# Patient Record
Sex: Female | Born: 1940 | Race: Black or African American | Hispanic: No | Marital: Single | Smoking: Former smoker
Health system: Southern US, Community
[De-identification: ages and names within clinical notes are randomized; demographics above are authoritative.]

## PROBLEM LIST (undated history)

## (undated) DIAGNOSIS — E785 Hyperlipidemia, unspecified: Secondary | ICD-10-CM

## (undated) DIAGNOSIS — M1990 Unspecified osteoarthritis, unspecified site: Secondary | ICD-10-CM

## (undated) DIAGNOSIS — N819 Female genital prolapse, unspecified: Secondary | ICD-10-CM

## (undated) DIAGNOSIS — K900 Celiac disease: Secondary | ICD-10-CM

## (undated) DIAGNOSIS — J449 Chronic obstructive pulmonary disease, unspecified: Secondary | ICD-10-CM

## (undated) DIAGNOSIS — K754 Autoimmune hepatitis: Secondary | ICD-10-CM

## (undated) DIAGNOSIS — K146 Glossodynia: Secondary | ICD-10-CM

## (undated) DIAGNOSIS — F32A Depression, unspecified: Secondary | ICD-10-CM

## (undated) DIAGNOSIS — K8020 Calculus of gallbladder without cholecystitis without obstruction: Secondary | ICD-10-CM

## (undated) DIAGNOSIS — I2510 Atherosclerotic heart disease of native coronary artery without angina pectoris: Secondary | ICD-10-CM

## (undated) DIAGNOSIS — L409 Psoriasis, unspecified: Secondary | ICD-10-CM

## (undated) DIAGNOSIS — J309 Allergic rhinitis, unspecified: Secondary | ICD-10-CM

## (undated) DIAGNOSIS — I10 Essential (primary) hypertension: Secondary | ICD-10-CM

## (undated) DIAGNOSIS — F329 Major depressive disorder, single episode, unspecified: Secondary | ICD-10-CM

## (undated) DIAGNOSIS — M87051 Idiopathic aseptic necrosis of right femur: Secondary | ICD-10-CM

## (undated) DIAGNOSIS — M359 Systemic involvement of connective tissue, unspecified: Secondary | ICD-10-CM

## (undated) HISTORY — PX: AMPUTATION FINGER: SHX6594

## (undated) HISTORY — PX: MUSCLE BIOPSY: SHX716

## (undated) HISTORY — PX: LIVER BIOPSY: SHX301

## (undated) HISTORY — PX: TUBAL LIGATION: SHX77

## (undated) HISTORY — PX: CORONARY ANGIOPLASTY WITH STENT PLACEMENT: SHX49

## (undated) HISTORY — PX: EYE SURGERY: SHX253

---

## 2004-10-04 ENCOUNTER — Ambulatory Visit: Payer: Self-pay | Admitting: Unknown Physician Specialty

## 2005-01-31 ENCOUNTER — Ambulatory Visit: Payer: Self-pay | Admitting: Gastroenterology

## 2005-12-09 ENCOUNTER — Ambulatory Visit: Payer: Self-pay | Admitting: Unknown Physician Specialty

## 2007-02-04 ENCOUNTER — Ambulatory Visit: Payer: Self-pay | Admitting: Unknown Physician Specialty

## 2008-02-09 ENCOUNTER — Ambulatory Visit: Payer: Self-pay | Admitting: Unknown Physician Specialty

## 2008-09-18 ENCOUNTER — Emergency Department: Payer: Self-pay | Admitting: Emergency Medicine

## 2009-01-30 ENCOUNTER — Inpatient Hospital Stay: Payer: Self-pay | Admitting: Internal Medicine

## 2009-01-30 ENCOUNTER — Ambulatory Visit: Payer: Self-pay | Admitting: Gastroenterology

## 2009-02-10 ENCOUNTER — Ambulatory Visit: Payer: Self-pay | Admitting: Unknown Physician Specialty

## 2009-04-21 ENCOUNTER — Ambulatory Visit: Payer: Self-pay | Admitting: Neurology

## 2009-05-31 ENCOUNTER — Inpatient Hospital Stay: Payer: Self-pay | Admitting: Internal Medicine

## 2009-07-06 ENCOUNTER — Ambulatory Visit: Payer: Self-pay | Admitting: Rheumatology

## 2009-07-24 ENCOUNTER — Ambulatory Visit: Payer: Self-pay | Admitting: Unknown Physician Specialty

## 2010-02-27 ENCOUNTER — Ambulatory Visit: Payer: Self-pay | Admitting: Unknown Physician Specialty

## 2010-04-09 ENCOUNTER — Ambulatory Visit: Payer: Self-pay | Admitting: Gastroenterology

## 2010-07-27 ENCOUNTER — Ambulatory Visit: Payer: Self-pay | Admitting: Gastroenterology

## 2010-09-07 ENCOUNTER — Ambulatory Visit: Payer: Self-pay | Admitting: Gastroenterology

## 2010-09-12 LAB — PATHOLOGY REPORT

## 2010-11-13 ENCOUNTER — Ambulatory Visit: Payer: Self-pay | Admitting: Gastroenterology

## 2010-11-22 ENCOUNTER — Ambulatory Visit: Payer: Self-pay | Admitting: Gastroenterology

## 2011-01-10 ENCOUNTER — Ambulatory Visit: Payer: Self-pay | Admitting: Gastroenterology

## 2011-01-29 LAB — PATHOLOGY REPORT

## 2011-03-12 ENCOUNTER — Ambulatory Visit: Payer: Self-pay | Admitting: Rheumatology

## 2011-03-14 ENCOUNTER — Ambulatory Visit: Payer: Self-pay | Admitting: Unknown Physician Specialty

## 2011-03-26 ENCOUNTER — Ambulatory Visit: Payer: Self-pay | Admitting: Unknown Physician Specialty

## 2012-06-04 ENCOUNTER — Ambulatory Visit: Payer: Self-pay | Admitting: Internal Medicine

## 2012-07-30 ENCOUNTER — Emergency Department: Payer: Self-pay | Admitting: Emergency Medicine

## 2012-07-30 LAB — URINALYSIS, COMPLETE
Glucose,UR: NEGATIVE mg/dL (ref 0–75)
Ketone: NEGATIVE
Nitrite: NEGATIVE
Ph: 6 (ref 4.5–8.0)
Protein: NEGATIVE
RBC,UR: 37 /HPF (ref 0–5)
Specific Gravity: 1.013 (ref 1.003–1.030)
Squamous Epithelial: <1

## 2012-07-30 LAB — CBC
HGB: 15.9 g/dL (ref 12.0–16.0)
RBC: 4.68 10*6/uL (ref 3.80–5.20)
RDW: 15.2 % — ABNORMAL HIGH (ref 11.5–14.5)
WBC: 6.2 10*3/uL (ref 3.6–11.0)

## 2012-08-01 LAB — URINE CULTURE

## 2013-06-22 ENCOUNTER — Ambulatory Visit: Payer: Self-pay | Admitting: Internal Medicine

## 2014-10-04 ENCOUNTER — Ambulatory Visit: Payer: Self-pay | Admitting: Internal Medicine

## 2015-07-21 ENCOUNTER — Other Ambulatory Visit: Payer: Self-pay | Admitting: Internal Medicine

## 2015-07-21 DIAGNOSIS — Z1231 Encounter for screening mammogram for malignant neoplasm of breast: Secondary | ICD-10-CM

## 2015-10-06 ENCOUNTER — Ambulatory Visit
Admission: RE | Admit: 2015-10-06 | Discharge: 2015-10-06 | Disposition: A | Discharge status: Home / Self Care | Payer: Medicare Other | Source: Ambulatory Visit | Attending: Internal Medicine | Admitting: Internal Medicine

## 2015-10-06 ENCOUNTER — Other Ambulatory Visit: Payer: Self-pay | Admitting: Internal Medicine

## 2015-10-06 DIAGNOSIS — Z1231 Encounter for screening mammogram for malignant neoplasm of breast: Secondary | ICD-10-CM | POA: Insufficient documentation

## 2016-06-14 ENCOUNTER — Other Ambulatory Visit: Payer: Self-pay | Admitting: Rheumatology

## 2016-06-14 DIAGNOSIS — R1310 Dysphagia, unspecified: Secondary | ICD-10-CM

## 2016-06-14 DIAGNOSIS — K754 Autoimmune hepatitis: Secondary | ICD-10-CM

## 2016-06-20 ENCOUNTER — Ambulatory Visit
Admission: RE | Admit: 2016-06-20 | Discharge: 2016-06-20 | Disposition: A | Discharge status: Home / Self Care | Payer: Medicare Other | Source: Ambulatory Visit | Attending: Rheumatology | Admitting: Rheumatology

## 2016-06-20 DIAGNOSIS — K754 Autoimmune hepatitis: Secondary | ICD-10-CM | POA: Diagnosis not present

## 2016-06-20 DIAGNOSIS — K449 Diaphragmatic hernia without obstruction or gangrene: Secondary | ICD-10-CM | POA: Diagnosis not present

## 2016-06-20 DIAGNOSIS — M359 Systemic involvement of connective tissue, unspecified: Secondary | ICD-10-CM | POA: Diagnosis not present

## 2016-06-20 DIAGNOSIS — R05 Cough: Secondary | ICD-10-CM | POA: Insufficient documentation

## 2016-06-20 DIAGNOSIS — R1310 Dysphagia, unspecified: Secondary | ICD-10-CM

## 2016-06-20 DIAGNOSIS — R938 Abnormal findings on diagnostic imaging of other specified body structures: Secondary | ICD-10-CM | POA: Diagnosis not present

## 2016-06-20 DIAGNOSIS — R1319 Other dysphagia: Secondary | ICD-10-CM | POA: Diagnosis not present

## 2016-06-20 DIAGNOSIS — M3322 Polymyositis with myopathy: Secondary | ICD-10-CM | POA: Diagnosis not present

## 2016-06-20 DIAGNOSIS — K219 Gastro-esophageal reflux disease without esophagitis: Secondary | ICD-10-CM | POA: Diagnosis not present

## 2016-07-04 ENCOUNTER — Other Ambulatory Visit: Payer: Self-pay | Admitting: Specialist

## 2016-07-04 DIAGNOSIS — R0602 Shortness of breath: Secondary | ICD-10-CM

## 2016-07-26 ENCOUNTER — Other Ambulatory Visit: Payer: Self-pay | Admitting: Internal Medicine

## 2016-07-26 DIAGNOSIS — G3184 Mild cognitive impairment, so stated: Secondary | ICD-10-CM

## 2016-08-01 ENCOUNTER — Ambulatory Visit
Admission: RE | Admit: 2016-08-01 | Discharge: 2016-08-01 | Disposition: A | Discharge status: Home / Self Care | Payer: Medicare Other | Source: Ambulatory Visit | Attending: Specialist | Admitting: Specialist

## 2016-08-01 DIAGNOSIS — M359 Systemic involvement of connective tissue, unspecified: Secondary | ICD-10-CM | POA: Diagnosis not present

## 2016-08-01 DIAGNOSIS — I700 Atherosclerosis of aorta: Secondary | ICD-10-CM | POA: Insufficient documentation

## 2016-08-01 DIAGNOSIS — K449 Diaphragmatic hernia without obstruction or gangrene: Secondary | ICD-10-CM | POA: Insufficient documentation

## 2016-08-01 DIAGNOSIS — I2510 Atherosclerotic heart disease of native coronary artery without angina pectoris: Secondary | ICD-10-CM | POA: Diagnosis not present

## 2016-08-01 DIAGNOSIS — R0602 Shortness of breath: Secondary | ICD-10-CM | POA: Insufficient documentation

## 2016-08-01 DIAGNOSIS — J8410 Pulmonary fibrosis, unspecified: Secondary | ICD-10-CM | POA: Insufficient documentation

## 2016-08-01 DIAGNOSIS — R918 Other nonspecific abnormal finding of lung field: Secondary | ICD-10-CM | POA: Insufficient documentation

## 2016-08-01 DIAGNOSIS — J479 Bronchiectasis, uncomplicated: Secondary | ICD-10-CM | POA: Diagnosis not present

## 2016-08-15 ENCOUNTER — Ambulatory Visit
Admission: RE | Admit: 2016-08-15 | Discharge: 2016-08-15 | Disposition: A | Discharge status: Home / Self Care | Payer: Medicare Other | Source: Ambulatory Visit | Attending: Internal Medicine | Admitting: Internal Medicine

## 2016-08-15 DIAGNOSIS — I739 Peripheral vascular disease, unspecified: Secondary | ICD-10-CM | POA: Insufficient documentation

## 2016-08-15 DIAGNOSIS — G3184 Mild cognitive impairment, so stated: Secondary | ICD-10-CM | POA: Insufficient documentation

## 2016-08-15 MED ORDER — GADOBENATE DIMEGLUMINE 529 MG/ML IV SOLN
15.0000 mL | Freq: Once | INTRAVENOUS | Status: AC | PRN
  Administered 2016-08-15: 15 mL via INTRAVENOUS

## 2016-10-07 ENCOUNTER — Encounter: Payer: Self-pay | Admitting: *Deleted

## 2016-10-08 ENCOUNTER — Encounter
Admission: RE | Disposition: A | Discharge status: Home / Self Care | Payer: Self-pay | Source: Ambulatory Visit | Attending: Gastroenterology

## 2016-10-08 ENCOUNTER — Encounter: Payer: Self-pay | Admitting: Anesthesiology

## 2016-10-08 ENCOUNTER — Ambulatory Visit
Admission: RE | Admit: 2016-10-08 | Discharge: 2016-10-08 | Disposition: A | Discharge status: Home / Self Care | Payer: Medicare Other | Source: Ambulatory Visit | Attending: Gastroenterology | Admitting: Gastroenterology

## 2016-10-08 ENCOUNTER — Ambulatory Visit: Payer: Medicare Other | Admitting: Anesthesiology

## 2016-10-08 DIAGNOSIS — Z7951 Long term (current) use of inhaled steroids: Secondary | ICD-10-CM | POA: Insufficient documentation

## 2016-10-08 DIAGNOSIS — K449 Diaphragmatic hernia without obstruction or gangrene: Secondary | ICD-10-CM | POA: Diagnosis not present

## 2016-10-08 DIAGNOSIS — I2510 Atherosclerotic heart disease of native coronary artery without angina pectoris: Secondary | ICD-10-CM | POA: Diagnosis not present

## 2016-10-08 DIAGNOSIS — K224 Dyskinesia of esophagus: Secondary | ICD-10-CM | POA: Insufficient documentation

## 2016-10-08 DIAGNOSIS — M1990 Unspecified osteoarthritis, unspecified site: Secondary | ICD-10-CM | POA: Diagnosis not present

## 2016-10-08 DIAGNOSIS — J849 Interstitial pulmonary disease, unspecified: Secondary | ICD-10-CM | POA: Diagnosis not present

## 2016-10-08 DIAGNOSIS — Z7952 Long term (current) use of systemic steroids: Secondary | ICD-10-CM | POA: Insufficient documentation

## 2016-10-08 DIAGNOSIS — K219 Gastro-esophageal reflux disease without esophagitis: Secondary | ICD-10-CM | POA: Diagnosis not present

## 2016-10-08 DIAGNOSIS — K2950 Unspecified chronic gastritis without bleeding: Secondary | ICD-10-CM | POA: Diagnosis not present

## 2016-10-08 DIAGNOSIS — I10 Essential (primary) hypertension: Secondary | ICD-10-CM | POA: Diagnosis not present

## 2016-10-08 DIAGNOSIS — J449 Chronic obstructive pulmonary disease, unspecified: Secondary | ICD-10-CM | POA: Insufficient documentation

## 2016-10-08 DIAGNOSIS — R933 Abnormal findings on diagnostic imaging of other parts of digestive tract: Secondary | ICD-10-CM | POA: Diagnosis present

## 2016-10-08 DIAGNOSIS — Z79899 Other long term (current) drug therapy: Secondary | ICD-10-CM | POA: Diagnosis not present

## 2016-10-08 DIAGNOSIS — I7300 Raynaud's syndrome without gangrene: Secondary | ICD-10-CM | POA: Insufficient documentation

## 2016-10-08 DIAGNOSIS — E785 Hyperlipidemia, unspecified: Secondary | ICD-10-CM | POA: Insufficient documentation

## 2016-10-08 DIAGNOSIS — F329 Major depressive disorder, single episode, unspecified: Secondary | ICD-10-CM | POA: Diagnosis not present

## 2016-10-08 DIAGNOSIS — Z87891 Personal history of nicotine dependence: Secondary | ICD-10-CM | POA: Insufficient documentation

## 2016-10-08 DIAGNOSIS — Z7983 Long term (current) use of bisphosphonates: Secondary | ICD-10-CM | POA: Insufficient documentation

## 2016-10-08 HISTORY — DX: Autoimmune hepatitis: K75.4

## 2016-10-08 HISTORY — DX: Glossodynia: K14.6

## 2016-10-08 HISTORY — DX: Atherosclerotic heart disease of native coronary artery without angina pectoris: I25.10

## 2016-10-08 HISTORY — DX: Calculus of gallbladder without cholecystitis without obstruction: K80.20

## 2016-10-08 HISTORY — DX: Idiopathic aseptic necrosis of right femur: M87.051

## 2016-10-08 HISTORY — DX: Essential (primary) hypertension: I10

## 2016-10-08 HISTORY — DX: Depression, unspecified: F32.A

## 2016-10-08 HISTORY — PX: ESOPHAGOGASTRODUODENOSCOPY (EGD) WITH PROPOFOL: SHX5813

## 2016-10-08 HISTORY — DX: Hyperlipidemia, unspecified: E78.5

## 2016-10-08 HISTORY — DX: Female genital prolapse, unspecified: N81.9

## 2016-10-08 HISTORY — DX: Celiac disease: K90.0

## 2016-10-08 HISTORY — DX: Systemic involvement of connective tissue, unspecified: M35.9

## 2016-10-08 HISTORY — DX: Psoriasis, unspecified: L40.9

## 2016-10-08 HISTORY — DX: Chronic obstructive pulmonary disease, unspecified: J44.9

## 2016-10-08 HISTORY — DX: Major depressive disorder, single episode, unspecified: F32.9

## 2016-10-08 HISTORY — DX: Allergic rhinitis, unspecified: J30.9

## 2016-10-08 HISTORY — DX: Unspecified osteoarthritis, unspecified site: M19.90

## 2016-10-08 SURGERY — ESOPHAGOGASTRODUODENOSCOPY (EGD) WITH PROPOFOL
Anesthesia: General

## 2016-10-08 MED ORDER — MIDAZOLAM HCL 2 MG/2ML IJ SOLN
INTRAMUSCULAR | Status: DC | PRN
  Administered 2016-10-08: 1 mg via INTRAVENOUS

## 2016-10-08 MED ORDER — PROPOFOL 500 MG/50ML IV EMUL
INTRAVENOUS | Status: DC | PRN
  Administered 2016-10-08: 120 ug/kg/min via INTRAVENOUS

## 2016-10-08 MED ORDER — FENTANYL CITRATE (PF) 100 MCG/2ML IJ SOLN
INTRAMUSCULAR | Status: DC | PRN
  Administered 2016-10-08: 50 ug via INTRAVENOUS

## 2016-10-08 MED ORDER — LIDOCAINE HCL (CARDIAC) 20 MG/ML IV SOLN
INTRAVENOUS | Status: DC | PRN
  Administered 2016-10-08: 30 mg via INTRAVENOUS

## 2016-10-08 MED ORDER — SODIUM CHLORIDE 0.9 % IV SOLN: INTRAVENOUS | Status: DC

## 2016-10-08 MED ORDER — SODIUM CHLORIDE 0.9 % IV SOLN
INTRAVENOUS | Status: DC
  Administered 2016-10-08: 07:00:00 via INTRAVENOUS

## 2016-10-08 MED ORDER — EPHEDRINE SULFATE 50 MG/ML IJ SOLN
INTRAMUSCULAR | Status: DC | PRN
  Administered 2016-10-08 (×2): 10 mg via INTRAVENOUS
  Administered 2016-10-08: 5 mg via INTRAVENOUS

## 2016-10-08 NOTE — H&P (Signed)
Outpatient short stay form Pre-procedure 10/08/2016 7:47 AM Lollie Sails MD  Primary Physician: Dr Tracie Harrier  Reason for visit:  EGD  History of present illness:  Patient is a 75 year old female presenting today for further evaluation of abnormal GI x-ray. She had a upper GI series in relation to atypical dysphagia that showed an irregularity at the bottom of the esophagus/GE junction region. There was no actual stenosis and indeed a broadening of the lumen in that area with a circular structure that did not look like a ring and did not seem to decrease the size of the lumen. She does have a history of mixed connective tissue disease with myopathy, positive F ANA, positive SCL 70 antibodies, positive SSA antibodies, Raynaud's syndrome, interstitial lung disease. She currently does take azathioprine as well as prednisone on a regular basis. She denies use of any aspirin products or blood thinning agents  She does have a history of previous diagnosis with celiac sprue. She is not on a strict gluten diet.  She is presenting today for further evaluation.    Current Facility-Administered Medications:  .  0.9 %  sodium chloride infusion, , Intravenous, Continuous, Lollie Sails, MD, Last Rate: 20 mL/hr at 10/08/16 0719 .  0.9 %  sodium chloride infusion, , Intravenous, Continuous, Lollie Sails, MD  Prescriptions Prior to Admission  Medication Sig Dispense Refill Last Dose  . albuterol (PROVENTIL HFA;VENTOLIN HFA) 108 (90 Base) MCG/ACT inhaler Inhale into the lungs every 6 (six) hours as needed for wheezing or shortness of breath.   10/07/2016 at Unknown time  . alendronate (FOSAMAX) 70 MG tablet Take 70 mg by mouth once a week. Take with a full glass of water on an empty stomach.   10/07/2016 at Unknown time  . azaTHIOprine (IMURAN) 50 MG tablet Take 50 mg by mouth daily.   10/07/2016 at Unknown time  . buPROPion (WELLBUTRIN SR) 150 MG 12 hr tablet Take 150 mg by mouth 2 (two)  times daily.   10/07/2016 at Unknown time  . Calcium Carb-Cholecalciferol (CALCIUM 500+D) 500-400 MG-UNIT TABS Take 1 tablet by mouth daily.   Past Week at Unknown time  . fluticasone (FLOVENT HFA) 110 MCG/ACT inhaler Inhale 1 puff into the lungs 2 (two) times daily.   10/07/2016 at Unknown time  . metoprolol succinate (TOPROL-XL) 100 MG 24 hr tablet Take 100 mg by mouth daily. Take with or immediately following a meal.   10/07/2016 at Unknown time  . multivitamin-iron-minerals-folic acid (CENTRUM) chewable tablet Chew 1 tablet by mouth daily.   Past Week at Unknown time  . olmesartan (BENICAR) 40 MG tablet Take 40 mg by mouth daily.   10/08/2016 at 0530  . omeprazole (PRILOSEC) 20 MG capsule Take 20 mg by mouth 2 (two) times daily before a meal.   10/07/2016 at Unknown time  . predniSONE (DELTASONE) 10 MG tablet Take 5 mg by mouth daily with breakfast.   10/07/2016 at Unknown time  . simvastatin (ZOCOR) 20 MG tablet Take 20 mg by mouth daily.   10/07/2016 at Unknown time  . traZODone (DESYREL) 50 MG tablet Take 50 mg by mouth at bedtime.   10/07/2016 at Unknown time  . triamterene-hydrochlorothiazide (MAXZIDE-25) 37.5-25 MG tablet Take 1 tablet by mouth daily.   10/07/2016 at Unknown time     Allergies  Allergen Reactions  . Fluorescein Other (See Comments)    intolerance  . Neo-Synephrine [Phenylephrine Hcl]   . Phenylephrine      Past Medical  History:  Diagnosis Date  . Allergic rhinitis   . Arthritis   . Autoimmune hepatitis ([place])   . Avascular necrosis of bone of hip, right ([place])   . Burning mouth syndrome   . Celiac sprue   . Connective tissue disease ([place])   . COPD (chronic obstructive pulmonary disease) ([place])   . Coronary artery disease   . Depression   . Gall stones   . Hyperlipidemia   . Hypertension   . Pelvic prolapse   . Psoriasis     Review of systems:      Physical Exam    Heart and lungs: Regular rate and rhythm without rub or gallop, lungs are  bilaterally clear    HEENT: Normocephalic atraumatic eyes are anicteric    Other:     Pertinant exam for procedure: Soft nontender nondistended bowel sounds positive normoactive.    Planned proceedures: EGD and indicated procedures. I have discussed the risks benefits and complications of procedures to include not limited to bleeding, infection, perforation and the risk of sedation and the patient wishes to proceed.    Lollie Sails, MD Gastroenterology 10/08/2016  7:47 AM

## 2016-10-08 NOTE — Anesthesia Procedure Notes (Signed)
Performed by: COOK-MARTIN, Marquette Blodgett Pre-anesthesia Checklist: Patient identified, Emergency Drugs available, Suction available, Patient being monitored and Timeout performed Patient Re-evaluated:Patient Re-evaluated prior to inductionOxygen Delivery Method: Nasal cannula Preoxygenation: Pre-oxygenation with 100% oxygen Intubation Type: IV induction Airway Equipment and Method: Bite block Placement Confirmation: CO2 detector and positive ETCO2     

## 2016-10-08 NOTE — Anesthesia Postprocedure Evaluation (Signed)
Anesthesia Post Note  Patient: Megan Baker  Procedure(s) Performed: Procedure(s) (LRB): ESOPHAGOGASTRODUODENOSCOPY (EGD) WITH PROPOFOL (N/A)  Patient location during evaluation: PACU Anesthesia Type: General Level of consciousness: awake Pain management: satisfactory to patient Vital Signs Assessment: post-procedure vital signs reviewed and stable Respiratory status: spontaneous breathing Cardiovascular status: stable Anesthetic complications: no    Last Vitals:  Vitals:   10/08/16 0840 10/08/16 0850  BP: (!) 114/51 (!) 115/51  Pulse: 86 95  Resp: 18 14  Temp:      Last Pain:  Vitals:   10/08/16 0820  TempSrc: Tympanic                 VAN Baker,Megan Jolliff

## 2016-10-08 NOTE — Anesthesia Preprocedure Evaluation (Addendum)
Anesthesia Evaluation  Patient identified by MRN, date of birth, ID band Patient awake    Reviewed: Allergy & Precautions, NPO status , Patient's Chart, lab work & pertinent test results  Airway Mallampati: III       Dental  (+) Upper Dentures, Lower Dentures   Pulmonary COPD, former smoker,    breath sounds clear to auscultation       Cardiovascular hypertension, Pt. on medications and Pt. on home beta blockers + CAD   Rhythm:Regular     Neuro/Psych Depression    GI/Hepatic negative GI ROS, (+) Hepatitis -, Autoimmune  Endo/Other  negative endocrine ROS  Renal/GU negative Renal ROS     Musculoskeletal   Abdominal   Peds  Hematology   Anesthesia Other Findings   Reproductive/Obstetrics                            Anesthesia Physical Anesthesia Plan  ASA: III  Anesthesia Plan: General   Post-op Pain Management:    Induction: Intravenous  Airway Management Planned: Natural Airway and Nasal Cannula  Additional Equipment:   Intra-op Plan:   Post-operative Plan:   Informed Consent: I have reviewed the patients History and Physical, chart, labs and discussed the procedure including the risks, benefits and alternatives for the proposed anesthesia with the patient or authorized representative who has indicated his/her understanding and acceptance.     Plan Discussed with: CRNA  Anesthesia Plan Comments:         Anesthesia Quick Evaluation

## 2016-10-08 NOTE — Transfer of Care (Signed)
Immediate Anesthesia Transfer of Care Note  Patient: Megan Baker  Procedure(s) Performed: Procedure(s): ESOPHAGOGASTRODUODENOSCOPY (EGD) WITH PROPOFOL (N/A)  Patient Location: PACU  Anesthesia Type:General  Level of Consciousness: awake and sedated  Airway & Oxygen Therapy: Patient Spontanous Breathing and Patient connected to nasal cannula oxygen  Post-op Assessment: Report given to RN and Post -op Vital signs reviewed and stable  Post vital signs: Reviewed and stable  Last Vitals:  Vitals:   10/08/16 0708  BP: (!) 145/69  Pulse: 87  Resp: 16  Temp: (!) 35.9 C    Last Pain:  Vitals:   10/08/16 0708  TempSrc: Tympanic         Complications: No apparent anesthesia complications

## 2016-10-08 NOTE — Op Note (Addendum)
[facility] Patient Name: Megan Baker Procedure Date: 10/08/2016 7:51 AM MRN: [id number] Account #: [id number] Date of Birth: Jul 08, 1941 Admit Type: Inpatient Age: 75 Room: [facility] ENDO ROOM 3 Gender: Female Note Status: Finalized Procedure:            Upper GI endoscopy Indications:          Gastro-esophageal reflux disease Providers:            Lollie Sails, MD Referring MD:         Tracie Harrier, MD (Referring MD) Medicines:            Monitored Anesthesia Care Complications:        No immediate complications. Procedure:            Pre-Anesthesia Assessment:                       - ASA Grade Assessment: III - A patient with severe                        systemic disease.                       After obtaining informed consent, the endoscope was                        passed under direct vision. Throughout the procedure,                        the patient's blood pressure, pulse, and oxygen                        saturations were monitored continuously. The Endoscope                        was introduced through the mouth, and advanced to the                        fourth part of duodenum. The upper GI endoscopy was                        accomplished without difficulty. The patient tolerated                        the procedure well. Findings:      Abnormal motility was noted in the upper third of the esophagus, in the       middle third of the esophagus and in the lower third of the esophagus.       The cricopharyngeus was normal. There is a decrease in motility of the       esophageal body. The distal esophagus/lower esophageal sphincter is       spastic, but gives up passage to the endoscope. There is a pouch-like       dilation of the esophagus for about 4 cm proximal to the GE junction.      Abnormal motility was noted at the gastroesophageal junction. The       cricopharyngeus was normal. The distal esophagus/lower esophageal      sphincter is spastic, but gives up passage to the endoscope.      Diffuse mild mucosal variance characterized by altered texture was found  in the entire examined stomach. Biopsies were taken with a cold forceps       for Helicobacter pylori testing. Biopsies were taken with a cold forceps       for histology.      A small hiatal hernia was present.      A TTS dilator was passed through the scope. Dilation with a 12-13.5-15       mm balloon dilator was performed to 15 mm at the gastroesophageal       junction.      Diffuse mucosal flattening was found in the entire examined duodenum.       Biopsies for histology were taken with a cold forceps for evaluation of       celiac disease.      The cardia and gastric fundus were normal on retroflexion otherwise. Impression:           - Abnormal esophageal motility, suspicious for                        aperistalsis.                       - Abnormal esophageal motility, suspicious for                        scleroderma.                       - Gastric mucosal variant. Biopsied.                       - Small hiatal hernia.                       - Flattened mucosa was found in the duodenum,                        consistent with celiac disease. Biopsied.                       - Dilation performed at the gastroesophageal junction.                        Of note the GE junction appears to have a more tonic                        closure, but there is no evidence of mass or                        inflammation. The entirre esophagus more proximal than                        the GE junction shows atonia, concern for possible                        achalasia versus changes of scleroderma. Recommendation:       - Continue present medications.                       - Await pathology results.                       - Return to GI clinic in 3 weeks.                       -  Gluten free diet. Procedure Code(s):    --- Professional ---                        [phone], Esophagogastroduodenoscopy, flexible, transoral;                        with transendoscopic balloon dilation of esophagus                        (less than 30 mm diameter)                       [id number], Esophagogastroduodenoscopy, flexible, transoral;                        with biopsy, single or multiple Diagnosis Code(s):    --- Professional ---                       K22.4, Dyskinesia of esophagus                       K31.89, Other diseases of stomach and duodenum                       K44.9, Diaphragmatic hernia without obstruction or                        gangrene                       K21.9, Gastro-esophageal reflux disease without                        esophagitis CPT copyright 2016 American Medical Association. All rights reserved. The codes documented in this report are preliminary and upon coder review may  be revised to meet current compliance requirements. Lollie Sails, MD 10/08/2016 8:29:16 AM This report has been signed electronically. Number of Addenda: 0 Note Initiated On: 10/08/2016 7:51 AM      [facility]

## 2016-10-10 LAB — SURGICAL PATHOLOGY

## 2016-10-12 ENCOUNTER — Encounter: Payer: Self-pay | Admitting: Gastroenterology

## 2017-04-24 ENCOUNTER — Other Ambulatory Visit: Payer: Self-pay | Admitting: Specialist

## 2017-04-24 DIAGNOSIS — R911 Solitary pulmonary nodule: Secondary | ICD-10-CM

## 2017-05-19 ENCOUNTER — Encounter: Payer: Self-pay | Admitting: Emergency Medicine

## 2017-05-19 ENCOUNTER — Emergency Department: Payer: Medicare Other

## 2017-05-19 ENCOUNTER — Emergency Department
Admission: EM | Admit: 2017-05-19 | Discharge: 2017-05-19 | Disposition: A | Discharge status: Home / Self Care | Payer: Medicare Other | Attending: Emergency Medicine | Admitting: Emergency Medicine

## 2017-05-19 DIAGNOSIS — S60221A Contusion of right hand, initial encounter: Secondary | ICD-10-CM | POA: Diagnosis not present

## 2017-05-19 DIAGNOSIS — I2510 Atherosclerotic heart disease of native coronary artery without angina pectoris: Secondary | ICD-10-CM | POA: Diagnosis not present

## 2017-05-19 DIAGNOSIS — W19XXXA Unspecified fall, initial encounter: Secondary | ICD-10-CM | POA: Insufficient documentation

## 2017-05-19 DIAGNOSIS — I10 Essential (primary) hypertension: Secondary | ICD-10-CM | POA: Insufficient documentation

## 2017-05-19 DIAGNOSIS — Z87891 Personal history of nicotine dependence: Secondary | ICD-10-CM | POA: Insufficient documentation

## 2017-05-19 DIAGNOSIS — J449 Chronic obstructive pulmonary disease, unspecified: Secondary | ICD-10-CM | POA: Insufficient documentation

## 2017-05-19 DIAGNOSIS — Y939 Activity, unspecified: Secondary | ICD-10-CM | POA: Diagnosis not present

## 2017-05-19 DIAGNOSIS — Z79899 Other long term (current) drug therapy: Secondary | ICD-10-CM | POA: Insufficient documentation

## 2017-05-19 DIAGNOSIS — S6991XA Unspecified injury of right wrist, hand and finger(s), initial encounter: Secondary | ICD-10-CM | POA: Diagnosis present

## 2017-05-19 DIAGNOSIS — Y929 Unspecified place or not applicable: Secondary | ICD-10-CM | POA: Insufficient documentation

## 2017-05-19 DIAGNOSIS — Y999 Unspecified external cause status: Secondary | ICD-10-CM | POA: Insufficient documentation

## 2017-05-19 NOTE — ED Notes (Signed)
See triage note  States she fell last pm  Right hand swollen and bruised  Increased pain with movement

## 2017-05-19 NOTE — ED Notes (Signed)
Pt alert and oriented X4, active, cooperative, pt in NAD. RR even and unlabored, color WNL.  Pt informed to return if any life threatening symptoms occur.   

## 2017-05-19 NOTE — ED Provider Notes (Signed)
[facility] Emergency Department Provider Note   ____________________________________________   I have reviewed the triage vital signs and the nursing notes.   HISTORY  Chief Complaint Hand Pain    HPI Megan Baker is a 76 y.o. female presents with right hand pain, swelling, and bruising after falling yesterday. She describes no sensation changes or loss of function of the right hand after the fall Patient is unable to fully describe the fall but she fell she landed on the back and the knuckles of her hand. She denies any other injuries or loss of consciousness from the fall. Patient denies any past injuries to the right hand or upper extremity.   Past Medical History:  Diagnosis Date  . Allergic rhinitis   . Arthritis   . Autoimmune hepatitis ([place])   . Avascular necrosis of bone of hip, right ([place])   . Burning mouth syndrome   . Celiac sprue   . Connective tissue disease ([place])   . COPD (chronic obstructive pulmonary disease) ([place])   . Coronary artery disease   . Depression   . Gall stones   . Hyperlipidemia   . Hypertension   . Pelvic prolapse   . Psoriasis     There are no active problems to display for this patient.   Past Surgical History:  Procedure Laterality Date  . AMPUTATION FINGER    . CORONARY ANGIOPLASTY WITH STENT PLACEMENT    . ESOPHAGOGASTRODUODENOSCOPY (EGD) WITH PROPOFOL N/A 10/08/2016   Procedure: ESOPHAGOGASTRODUODENOSCOPY (EGD) WITH PROPOFOL;  Surgeon: Lollie Sails, MD;  Location: [facility] ENDOSCOPY;  Service: Endoscopy;  Laterality: N/A;  . EYE SURGERY    . LIVER BIOPSY    . MUSCLE BIOPSY    . TUBAL LIGATION      Prior to Admission medications   Medication Sig Start Date End Date Taking? Authorizing Provider  albuterol (PROVENTIL HFA;VENTOLIN HFA) 108 (90 Base) MCG/ACT inhaler Inhale into the lungs every 6 (six) hours as needed for wheezing or shortness of breath.    [provider]  alendronate  (FOSAMAX) 70 MG tablet Take 70 mg by mouth once a week. Take with a full glass of water on an empty stomach.    [provider]  azaTHIOprine (IMURAN) 50 MG tablet Take 50 mg by mouth daily.    [provider]  buPROPion (WELLBUTRIN SR) 150 MG 12 hr tablet Take 150 mg by mouth 2 (two) times daily.    [provider]  Calcium Carb-Cholecalciferol (CALCIUM 500+D) 500-400 MG-UNIT TABS Take 1 tablet by mouth daily.    [provider]  fluticasone (FLOVENT HFA) 110 MCG/ACT inhaler Inhale 1 puff into the lungs 2 (two) times daily.    [provider]  metoprolol succinate (TOPROL-XL) 100 MG 24 hr tablet Take 100 mg by mouth daily. Take with or immediately following a meal.    [provider]  multivitamin-iron-minerals-folic acid (CENTRUM) chewable tablet Chew 1 tablet by mouth daily.    [provider]  olmesartan (BENICAR) 40 MG tablet Take 40 mg by mouth daily.    [provider]  omeprazole (PRILOSEC) 20 MG capsule Take 20 mg by mouth 2 (two) times daily before a meal.    [provider]  predniSONE (DELTASONE) 10 MG tablet Take 5 mg by mouth daily with breakfast.    [provider]  simvastatin (ZOCOR) 20 MG tablet Take 20 mg by mouth daily.    [provider]  traZODone (DESYREL) 50  MG tablet Take 50 mg by mouth at bedtime.    [provider]  triamterene-hydrochlorothiazide (MAXZIDE-25) 37.5-25 MG tablet Take 1 tablet by mouth daily.    [provider]    Allergies Fluorescein; Neo-synephrine [phenylephrine hcl]; and Phenylephrine  Family History  Problem Relation Age of Onset  . Breast cancer Neg Hx     Social History Social History  Substance Use Topics  . Smoking status: Former Research scientist (life sciences)  . Smokeless tobacco: Never Used  . Alcohol use No    Review of Systems Constitutional: Negative for fever/chills Eyes: No visual changes. Cardiovascular: Denies chest  pain. Respiratory: Denies shortness of breath. Musculoskeletal: Right hand pain, swelling and bruising. Skin: Negative for rash. Neurological: Negative focal weakness or numbness. Negative for loss of consciousness. ____________________________________________   PHYSICAL EXAM:  VITAL SIGNS: ED Triage Vitals [05/19/17 1111]  Enc Vitals Group     BP (!) 144/63     Pulse Rate 85     Resp 16     Temp 98.6 F (37 C)     Temp Source Oral     SpO2 98 %     Weight 160 lb (72.6 kg)     Height 5' 2"  (1.575 m)     Head Circumference      Peak Flow      Pain Score 8     Pain Loc      Pain Edu?      Excl. in [place]?     Constitutional: Alert and oriented. Well appearing and in no acute distress.  Head: Normocephalic and atraumatic. Eyes: Conjunctivae are normal.  Cardiovascular: Normal rate, regular rhythm. Normal distal pulses. Respiratory: Normal respiratory effort.  Musculoskeletal: Right hand contusion with ecchymosis along the dorsal aspect secondary to fall. Sensation and range of motion intact. Neurologic: Normal speech and language. No gross focal neurologic deficits are appreciated.  Skin:  Skin is warm, dry and intact. No rash noted. Psychiatric: Mood and affect are normal.  ____________________________________________   LABS (all labs ordered are listed, but only abnormal results are displayed)  Labs Reviewed - No data to display ____________________________________________  EKG None ____________________________________________  RADIOLOGY DG right hand  FINDINGS: Osseous demineralization. Minimal degenerative changes at first [facility] joint. No acute fracture, dislocation, or bone destruction. Soft tissue swelling at dorsum of hand overlying the MCP joints.  IMPRESSION: No acute osseous abnormalities. ____________________________________________   PROCEDURES  Procedure(s) performed: no    Critical Care performed:  no ____________________________________________   INITIAL IMPRESSION / ASSESSMENT AND PLAN / ED COURSE  Pertinent labs & imaging results that were available during my care of the patient were reviewed by me and considered in my medical decision making (see chart for details).  Patient presents with right hand pain, contusion with ecchymosis secondary to fall yesterday. Physical exam findings and imaging are reassuring for no acute fracture or neurovascular deficits to the right hand. Recommended patient modify daily activities, use the RICE method for symptom management in addition to NSAIDs as needed. Patient / Family informed of clinical course, understand medical decision-making process, and agree with plan.  Patient was advised to follow up with PCP and was also advised to return to the emergency department for symptoms that change or worsen.      ____________________________________________   FINAL CLINICAL IMPRESSION(S) / ED DIAGNOSES  Final diagnoses:  None       NEW MEDICATIONS STARTED DURING THIS VISIT:  New Prescriptions   No medications on file  Note:  This document was prepared using Dragon voice recognition software and may include unintentional dictation errors.    Jerolyn Shin, PA-C 05/19/17 1325    Harvest Dark, MD 05/19/17 1402

## 2017-05-19 NOTE — ED Triage Notes (Signed)
Pt reports falling onto right hand last night, bruising and swelling noted to knuckles.

## 2017-08-19 ENCOUNTER — Ambulatory Visit: Payer: Medicare Other

## 2017-08-28 ENCOUNTER — Ambulatory Visit
Admission: RE | Admit: 2017-08-28 | Discharge: 2017-08-28 | Disposition: A | Discharge status: Home / Self Care | Payer: Medicare Other | Source: Ambulatory Visit | Attending: Specialist | Admitting: Specialist

## 2017-08-28 DIAGNOSIS — J8410 Pulmonary fibrosis, unspecified: Secondary | ICD-10-CM | POA: Diagnosis present

## 2017-08-28 DIAGNOSIS — J479 Bronchiectasis, uncomplicated: Secondary | ICD-10-CM | POA: Diagnosis not present

## 2017-08-28 DIAGNOSIS — R911 Solitary pulmonary nodule: Secondary | ICD-10-CM

## 2017-08-28 DIAGNOSIS — K449 Diaphragmatic hernia without obstruction or gangrene: Secondary | ICD-10-CM | POA: Insufficient documentation

## 2017-08-28 DIAGNOSIS — I700 Atherosclerosis of aorta: Secondary | ICD-10-CM | POA: Diagnosis not present

## 2017-08-28 DIAGNOSIS — I2510 Atherosclerotic heart disease of native coronary artery without angina pectoris: Secondary | ICD-10-CM | POA: Insufficient documentation

## 2017-09-18 ENCOUNTER — Other Ambulatory Visit: Payer: Self-pay | Admitting: Specialist

## 2017-09-18 DIAGNOSIS — R911 Solitary pulmonary nodule: Secondary | ICD-10-CM

## 2018-01-22 ENCOUNTER — Other Ambulatory Visit: Payer: Self-pay | Admitting: Internal Medicine

## 2018-01-22 DIAGNOSIS — Z1231 Encounter for screening mammogram for malignant neoplasm of breast: Secondary | ICD-10-CM

## 2018-02-05 ENCOUNTER — Other Ambulatory Visit: Payer: Self-pay | Admitting: Gastroenterology

## 2018-02-05 DIAGNOSIS — R1111 Vomiting without nausea: Secondary | ICD-10-CM

## 2018-02-05 DIAGNOSIS — R634 Abnormal weight loss: Secondary | ICD-10-CM

## 2018-03-05 ENCOUNTER — Ambulatory Visit
Admission: RE | Admit: 2018-03-05 | Discharge: 2018-03-05 | Disposition: A | Discharge status: Home / Self Care | Payer: Medicare Other | Source: Ambulatory Visit | Attending: Gastroenterology | Admitting: Gastroenterology

## 2018-03-05 DIAGNOSIS — R1111 Vomiting without nausea: Secondary | ICD-10-CM | POA: Insufficient documentation

## 2018-03-05 DIAGNOSIS — R634 Abnormal weight loss: Secondary | ICD-10-CM | POA: Insufficient documentation

## 2018-03-05 LAB — POCT I-STAT CREATININE: CREATININE: 1 mg/dL (ref 0.44–1.00)

## 2018-03-05 MED ORDER — IOPAMIDOL (ISOVUE-300) INJECTION 61%
100.0000 mL | Freq: Once | INTRAVENOUS | Status: AC | PRN
  Administered 2018-03-05: 100 mL via INTRAVENOUS

## 2018-03-10 ENCOUNTER — Encounter (INDEPENDENT_AMBULATORY_CARE_PROVIDER_SITE_OTHER): Payer: Self-pay | Admitting: Vascular Surgery

## 2018-03-10 ENCOUNTER — Ambulatory Visit (INDEPENDENT_AMBULATORY_CARE_PROVIDER_SITE_OTHER): Payer: Medicare Other | Admitting: Vascular Surgery

## 2018-03-10 VITALS — BP 118/75 | HR 73 | Resp 16 | Ht 62.0 in | Wt 136.2 lb

## 2018-03-10 DIAGNOSIS — I10 Essential (primary) hypertension: Secondary | ICD-10-CM

## 2018-03-10 DIAGNOSIS — K551 Chronic vascular disorders of intestine: Secondary | ICD-10-CM | POA: Diagnosis not present

## 2018-03-10 DIAGNOSIS — E785 Hyperlipidemia, unspecified: Secondary | ICD-10-CM | POA: Diagnosis not present

## 2018-03-10 NOTE — Assessment & Plan Note (Signed)
lipid control important in reducing the progression of atherosclerotic disease. Continue statin therapy  

## 2018-03-10 NOTE — Patient Instructions (Signed)
Chronic Mesenteric Ischemia Mesenteric ischemia is poor blood flow (circulation) in the vessels that supply blood to the stomach, intestines, and liver (mesenteric organs). Chronic mesenteric ischemia, also called mesenteric angina or intestinal angina, is a long-term (chronic) condition. It happens when an artery or vein that provides blood to the mesenteric organs gradually becomes blocked or narrow, restricting the blood supply to the organs. When the blood supply is severely restricted, the mesenteric organs cannot work properly. What are the causes? This condition is commonly caused by fatty deposits that build up in an artery (plaque), which can narrow the artery and restrict blood flow. Other causes include:  Weakened areas in blood vessel walls (aneurysms).  Conditions that cause twisting or inflammation of blood vessels, such as fibromuscular dysplasia or arteritis.  A disorder in which blood clots form in the veins (venous thrombosis).  Scarring and thickening (fibrosis) of blood vessels caused by radiation therapy.  A tear in the aorta, the body's main artery (aortic dissection).  Blood vessel problems after illegal drug use, such as use of cocaine.  Tumors in the nervous system (neurofibromatosis).  Certain autoimmune diseases, such as lupus.  What increases the risk? The following factors may make you more likely to develop this condition:  Being female.  Being over age 50, especially if you have a history of heart problems.  Smoking.  Congestive heart failure.  Irregular heartbeat (arrhythmia).  Having a history of heart attack or stroke.  Diabetes.  High cholesterol.  High blood pressure (hypertension).  Being overweight or obese.  Kidney disease (renal disease) requiring dialysis.  What are the signs or symptoms? Symptoms of this condition include:  Abdomen (abdominal) pain or cramps that develop 15-60 minutes after a meal. This pain may last for 1-3  hours. Some people may develop a fear of eating because of this symptom.  Weight loss.  Diarrhea.  Bloody stool.  Nausea.  Vomiting.  Bloating.  Abdominal pain after stress or with exercise.  How is this diagnosed? This condition is diagnosed based on:  Your medical history.  A physical exam.  Tests, such as: ? Ultrasound. ? CT scan. ? Blood tests. ? Urine tests. ? An imaging test that involves injecting a dye into your arteries to show blood flow through blood vessels (angiogram). This can help to show if there are any blockages in the vessels that lead to the intestines. ? Passing a small probe through the mouth and into the stomach to measure the output of carbon dioxide (gastric tonometry). This can help to indicate whether there is decreased blood flow to the stomach and intestines.  How is this treated? This condition may be treated with:  Dietary changes such as eating smaller, low-fat, meals more frequently.  Lifestyle changes to treat underlying conditions that contribute to the disease, such as high cholesterol and high blood pressure.  Medicines to reduce blood clotting and increase blood flow.  Surgery to remove the blockage, repair arteries or veins, and restore blood flow. This may involve: ? Angioplasty. This is surgery to widen the affected artery, reduce the blockage, and sometimes insert a small, mesh tube (stent). ? Bypass surgery. This may be done to go around (bypass) the blockage and reconnect healthy arteries or veins. ? Placing a stent in the affected area. This may be done to help keep blocked arteries open.  Follow these instructions at home: Eating and drinking  Eat a heart-healthy diet. This includes fresh fruits and vegetables, whole grains, and lean proteins   like chicken, fish, eggs, and beans.  Avoid foods that contain a lot of: ? Salt (sodium). ? Sugar. ? Saturated fat (such as red meat). ? Trans fat (such as fried foods).  Stay  hydrated. Drink enough fluid to keep your urine clear or pale yellow. Lifestyle  Stay active and get regular exercise as told by your health care provider. Aim for 150 minutes of moderate activity or 75 minutes of vigorous activity a week. Ask your health care provider what activities and forms of exercise are safe for you.  Maintain a healthy weight.  Work with your health care provider to manage your cholesterol.  Manage any other health problems you have, such as high blood pressure, diabetes, or heart rhythm problems.  Do not use any products that contain nicotine or tobacco, such as cigarettes and e-cigarettes. If you need help quitting, ask your health care provider. General instructions  Take over-the-counter and prescription medicines only as told by your health care provider.  Keep all follow-up visits as told by your health care provider. This is important. Contact a health care provider if:  Your symptoms do not improve or they return after treatment.  You have a fever. Get help right away if:  You have severe abdominal pain.  You have severe chest pain.  You have shortness of breath.  You feel weak or dizzy.  You have palpitations.  You have numbness or weakness in your face, arm, or leg.  You are confused.  You have trouble speaking or people have trouble understanding what you are saying.  You are constipated.  You have trouble urinating.  You have blood in your stool.  You have severe nausea, vomiting, or persistent diarrhea. Summary  Mesenteric ischemia is poor circulation in the vessels that supply blood to the the stomach, intestines, and liver (mesenteric organs).  This condition happens when an artery or vein that provides blood to the mesenteric organs gradually becomes blocked or narrow, restricting the blood supply to the organs.  This condition is commonly caused by fatty deposits that build up in an artery (plaque), which can narrow the  artery and restrict blood flow.  You are more likely to develop this condition if you are over age 50 and have a history of heart problems, high blood pressure, diabetes, or high cholesterol.  This condition is usually treated with medicines, dietary and lifestyle changes, and surgery to remove the blockage, repair arteries or veins, and restore blood flow. This information is not intended to replace advice given to you by your health care provider. Make sure you discuss any questions you have with your health care provider. Document Released: 07/29/2011 Document Revised: 11/23/2016 Document Reviewed: 11/23/2016 Elsevier Interactive Patient Education  2017 [employer]  

## 2018-03-10 NOTE — Progress Notes (Signed)
Patient ID: Megan Baker, female   DOB: September 01, 1941, 77 y.o.   MRN: [id number]  Chief Complaint  Patient presents with  . New Patient (Initial Visit)    asap ref Ellin Mayhew for Mesentric    HPI Megan Baker is a 77 y.o. female.  I am asked to see the patient by J. Ellin Mayhew, NP for evaluation of mesenteric stenosis.  The patient reports weight loss and difficulties with nausea and vomiting postprandially.  Within 30 minutes of a meal, she usually has to throw up.  Although this has been worse over the past several months, this has been on and off over many years.  She says it started about 9 or 10 years ago she thinks.  No clear inciting event or causative factor that started the symptoms.  She has had gradual weight loss more significant over the past several months.  As part of her workup she had a CT scan which I have independently reviewed.  She does have significant aortoiliac calcification as well as at least moderate calcification of the celiac and superior mesenteric arteries.  It is difficult to discern the degree of stenosis from these images.  The official report suggests a hemodynamically significant stenosis although I am not sure about that.  The IMA is small but does appear patent.   Past Medical History:  Diagnosis Date  . Allergic rhinitis   . Arthritis   . Autoimmune hepatitis ([place])   . Avascular necrosis of bone of hip, right ([place])   . Burning mouth syndrome   . Celiac sprue   . Connective tissue disease ([place])   . COPD (chronic obstructive pulmonary disease) ([place])   . Coronary artery disease   . Depression   . Gall stones   . Hyperlipidemia   . Hypertension   . Pelvic prolapse   . Psoriasis     Past Surgical History:  Procedure Laterality Date  . AMPUTATION FINGER    . CORONARY ANGIOPLASTY WITH STENT PLACEMENT    . ESOPHAGOGASTRODUODENOSCOPY (EGD) WITH PROPOFOL N/A 10/08/2016   Procedure: ESOPHAGOGASTRODUODENOSCOPY (EGD) WITH PROPOFOL;  Surgeon: Lollie Sails, MD;  Location[facility] ENDOSCOPY;  Service: Endoscopy;  Laterality: N/A;  . EYE SURGERY    . LIVER BIOPSY    . MUSCLE BIOPSY    . TUBAL LIGATION      Family History  Problem Relation Age of Onset  . Breast cancer Neg Hx   No bleeding disorders, clotting disorders, porphyria, or aneurysms  Social History Social History   Tobacco Use  . Smoking status: Former Research scientist (life sciences)  . Smokeless tobacco: Never Used  Substance Use Topics  . Alcohol use: No  . Drug use: No     Allergies  Allergen Reactions  . Fluorescein Other (See Comments)    intolerance  . Neo-Synephrine [Phenylephrine Hcl]   . Phenylephrine     Current Outpatient Medications  Medication Sig Dispense Refill  . albuterol (PROVENTIL HFA;VENTOLIN HFA) 108 (90 Base) MCG/ACT inhaler Inhale into the lungs every 6 (six) hours as needed for wheezing or shortness of breath.    Marland Kitchen alendronate (FOSAMAX) 70 MG tablet Take 70 mg by mouth once a week. Take with a full glass of water on an empty stomach.    Marland Kitchen azaTHIOprine (IMURAN) 50 MG tablet Take 50 mg by mouth daily.    Marland Kitchen buPROPion (WELLBUTRIN SR) 150 MG 12 hr tablet Take 150 mg by mouth 2 (two) times daily.    . Calcium Carb-Cholecalciferol (  CALCIUM 500+D) 500-400 MG-UNIT TABS Take 1 tablet by mouth daily.    . fluticasone (FLOVENT HFA) 110 MCG/ACT inhaler Inhale 1 puff into the lungs 2 (two) times daily.    . metoprolol succinate (TOPROL-XL) 100 MG 24 hr tablet Take 100 mg by mouth daily. Take with or immediately following a meal.    . multivitamin-iron-minerals-folic acid (CENTRUM) chewable tablet Chew 1 tablet by mouth daily.    Marland Kitchen olmesartan (BENICAR) 40 MG tablet Take 40 mg by mouth daily.    Marland Kitchen omeprazole (PRILOSEC) 20 MG capsule Take 20 mg by mouth 2 (two) times daily before a meal.    . simvastatin (ZOCOR) 20 MG tablet Take 20 mg by mouth daily.    . traZODone (DESYREL) 50 MG tablet Take 50 mg by mouth at bedtime.    . triamterene-hydrochlorothiazide (MAXZIDE-25) 37.5-25  MG tablet Take 1 tablet by mouth daily.    . predniSONE (DELTASONE) 10 MG tablet Take 5 mg by mouth daily with breakfast.     No current facility-administered medications for this visit.       REVIEW OF SYSTEMS (Negative unless checked)  Constitutional: [x] Weight loss  [] Fever  [] Chills Cardiac: [] Chest pain   [] Chest pressure   [] Palpitations   [] Shortness of breath when laying flat   [] Shortness of breath at rest   [] Shortness of breath with exertion. Vascular:  [] Pain in legs with walking   [] Pain in legs at rest   [] Pain in legs when laying flat   [] Claudication   [] Pain in feet when walking  [] Pain in feet at rest  [] Pain in feet when laying flat   [] History of DVT   [] Phlebitis   [] Swelling in legs   [] Varicose veins   [] Non-healing ulcers Pulmonary:   [] Uses home oxygen   [] Productive cough   [] Hemoptysis   [] Wheeze  [x] COPD   [] Asthma Neurologic:  [] Dizziness  [] Blackouts   [] Seizures   [] History of stroke   [] History of TIA  [] Aphasia   [] Temporary blindness   [] Dysphagia   [] Weakness or numbness in arms   [] Weakness or numbness in legs Musculoskeletal:  [x] Arthritis   [] Joint swelling   [] Joint pain   [] Low back pain Hematologic:  [] Easy bruising  [] Easy bleeding   [] Hypercoagulable state   [] Anemic  [] Hepatitis Gastrointestinal:  [] Blood in stool   [] Vomiting blood  [x] Gastroesophageal reflux/heartburn   [x] Abdominal pain Genitourinary:  [] Chronic kidney disease   [x] Difficult urination  [] Frequent urination  [] Burning with urination   [] Hematuria Skin:  [x] Rashes   [] Ulcers   [] Wounds Psychological:  [] History of anxiety   []  History of major depression.    Physical Exam BP 118/75 (BP Location: Right Arm)   Pulse 73   Resp 16   Ht 5' 2"  (1.575 m)   Wt 61.8 kg (136 lb 3.2 oz)   BMI 24.91 kg/m  Gen:  WD/WN, NAD Head: [place]/AT, + temporalis wasting.  Ear/Nose/Throat: Hearing grossly intact, nares w/o erythema or drainage, oropharynx w/o Erythema/Exudate Eyes: Conjunctiva  clear, sclera non-icteric  Neck: trachea midline.  No bruit.  Pulmonary:  Good air movement, clear to auscultation bilaterally.  Cardiac: RRR, no JVD Vascular:  Vessel Right Left  Radial Palpable Palpable                                   Gastrointestinal: soft, non-tender/non-distended. No guarding/reflex.  Musculoskeletal: M/S 5/5 throughout.  Extremities without ischemic changes.  No deformity or atrophy. Trace LE edema. Neurologic: Sensation grossly intact in extremities.  Symmetrical.  Speech is fluent. Motor exam as listed above. Psychiatric: Judgment intact, Mood & affect appropriate for pt's clinical situation. Dermatologic: No rashes or ulcers noted.  No cellulitis or open wounds.  Radiology Ct Abdomen Pelvis W Contrast  Addendum Date: 03/05/2018   ADDENDUM REPORT: 03/05/2018 14:01 ADDENDUM: Add to IMPRESSION: There is extensive coronary artery calcification. Aortic Atherosclerosis (ICD10-I70.0). Electronically Signed   By: Lowella Grip III M.D.   On: 03/05/2018 14:01   Result Date: 03/05/2018 CLINICAL DATA:  Unintentional weight loss and loss of appetite. Intermittent nausea, vomiting, and diarrhea EXAM: CT ABDOMEN AND PELVIS WITH CONTRAST TECHNIQUE: Multidetector CT imaging of the abdomen and pelvis was performed using the standard protocol following bolus administration of intravenous contrast. Oral contrast was also administered. CONTRAST:  136m ISOVUE-300 IOPAMIDOL (ISOVUE-300) INJECTION 61% COMPARISON:  January 30, 2009 FINDINGS: Lower chest: There is cylindrical bronchiectasis in the lower lobes posteriorly and medially. No lung base edema or consolidation. There is extensive coronary artery calcification. There is a focal hiatal hernia. Hepatobiliary: There is suspected degree of hepatic steatosis. No focal liver lesions are evident. There is cholelithiasis. Gallbladder wall appears slightly thickened. No pericholecystic fluid. No biliary duct dilatation. Pancreas:  There is no pancreatic mass or inflammatory focus. Spleen: No splenic lesions are evident. Adrenals/Urinary Tract: Adrenals appear unremarkable bilaterally. Kidneys bilaterally show no evident mass or hydronephrosis on either side. No renal or ureteral calculus evident. Urinary bladder is midline with wall thickness within normal limits. Stomach/Bowel: Rectum is mildly distended with stool and air. There is no rectal wall thickening. There is no appreciable bowel wall or mesenteric thickening. No bowel obstruction. No free air or portal venous air. There is no appreciable bowel pneumatosis. Vascular/Lymphatic: There is atherosclerotic calcification in the aorta and iliac arteries. No aneurysm evident. There is extensive calcification in the proximal major mesenteric vessels without frank vessel obstruction. There is no adenopathy in the abdomen or pelvis. Reproductive: The uterus is canted toward the left, stable. There are multiple calcifications in the uterus consistent with leiomyomatous change. No extrauterine pelvic mass evident. Other: Appendix appears normal. No abscess or ascites is evident in the abdomen or pelvis. There is a small ventral hernia containing only fat. Musculoskeletal: There is apparent Paget's disease throughout much of the left iliac crest as well as in the right proximal femur. No lytic or destructive bone lesions are evident. No intramuscular lesion or abdominal wall lesion. IMPRESSION: 1. Cholelithiasis with mild gallbladder wall thickening. Question a degree of cholecystitis. This finding may warrant nuclear medicine hepatobiliary imaging study to assess for cystic duct patency. 2. Extensive aortoiliac and major mesenteric arterial vascular calcification. There does appear to be hemodynamically significant narrowing in the proximal celiac and superior mesenteric arteries. Inferior mesenteric artery is patent. No overt signs of bowel ischemia noted by CT currently. 3. No bowel  obstruction or pneumatosis. No abscess. Appendix appears normal. 4.  No renal or ureteral calculus.  No hydronephrosis. 5.  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 6.  Focal hiatal hernia.  Small ventral hernia containing only fat. 7.  Chronic lower lobe bronchiectatic change. 8. 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 Electronically Signed: By: WLowella GripIII M.D. On: 03/05/2018 13:50    Labs Recent Results (from the past 2160 hour(s))  I-STAT creatinine     Status: None   Collection Time: 03/05/18 10:24 AM  Result  Value Ref Range   Creatinine, Ser 1.00 0.44 - 1.00 mg/dL    Assessment/Plan:  Hyperlipidemia lipid control important in reducing the progression of atherosclerotic disease. Continue statin therapy   Hypertension blood pressure control important in reducing the progression of atherosclerotic disease. On appropriate oral medications.   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  She does have significant aortoiliac calcification as well as at least moderate calcification of the celiac and superior mesenteric arteries.  It is difficult to discern the degree of stenosis from these images.  The official report suggests a hemodynamically significant stenosis although I am not sure about that.  The IMA is small but does appear patent.  Although her symptoms are not entirely classic chronic mesenteric ischemia, that could certainly be a possibility.  This may be more a esophageal mobility/achalasia issue although it is not entirely clear.  I believe a mesenteric duplex would be helpful to help discern the degree of flow limitation from these atherosclerotic plaques.  If significant stenosis is seen, she would likely benefit from intervention.  If her mesenteric flow is reasonably well preserved, there is likely not much use of intervention.  I will see her back following her  mesenteric duplex to discuss the results and determine further treatment options.      Leotis Pain 03/10/2018, 11:40 AM   This note was created with Dragon medical transcription system.  Any errors from dictation are unintentional.

## 2018-03-10 NOTE — Assessment & Plan Note (Signed)
As part of her workup she had a CT scan which I have independently reviewed.  She does have significant aortoiliac calcification as well as at least moderate calcification of the celiac and superior mesenteric arteries.  It is difficult to discern the degree of stenosis from these images.  The official report suggests a hemodynamically significant stenosis although I am not sure about that.  The IMA is small but does appear patent.  Although her symptoms are not entirely classic chronic mesenteric ischemia, that could certainly be a possibility.  This may be more a esophageal mobility/achalasia issue although it is not entirely clear.  I believe a mesenteric duplex would be helpful to help discern the degree of flow limitation from these atherosclerotic plaques.  If significant stenosis is seen, she would likely benefit from intervention.  If her mesenteric flow is reasonably well preserved, there is likely not much use of intervention.  I will see her back following her mesenteric duplex to discuss the results and determine further treatment options.

## 2018-03-10 NOTE — Assessment & Plan Note (Signed)
blood pressure control important in reducing the progression of atherosclerotic disease. On appropriate oral medications.  

## 2018-03-11 ENCOUNTER — Encounter (INDEPENDENT_AMBULATORY_CARE_PROVIDER_SITE_OTHER): Payer: Self-pay

## 2018-04-08 ENCOUNTER — Other Ambulatory Visit (INDEPENDENT_AMBULATORY_CARE_PROVIDER_SITE_OTHER): Payer: Self-pay | Admitting: Vascular Surgery

## 2018-04-08 DIAGNOSIS — I7090 Unspecified atherosclerosis: Secondary | ICD-10-CM

## 2018-04-09 ENCOUNTER — Ambulatory Visit (INDEPENDENT_AMBULATORY_CARE_PROVIDER_SITE_OTHER): Payer: Medicare Other

## 2018-04-09 ENCOUNTER — Encounter (INDEPENDENT_AMBULATORY_CARE_PROVIDER_SITE_OTHER): Payer: Self-pay

## 2018-04-09 ENCOUNTER — Encounter (INDEPENDENT_AMBULATORY_CARE_PROVIDER_SITE_OTHER): Payer: Self-pay | Admitting: Vascular Surgery

## 2018-04-09 ENCOUNTER — Ambulatory Visit (INDEPENDENT_AMBULATORY_CARE_PROVIDER_SITE_OTHER): Payer: Medicare Other | Admitting: Vascular Surgery

## 2018-04-09 VITALS — BP 129/75 | HR 65 | Resp 13 | Ht 62.0 in | Wt 135.0 lb

## 2018-04-09 DIAGNOSIS — K551 Chronic vascular disorders of intestine: Secondary | ICD-10-CM

## 2018-04-09 DIAGNOSIS — I10 Essential (primary) hypertension: Secondary | ICD-10-CM | POA: Diagnosis not present

## 2018-04-09 DIAGNOSIS — E785 Hyperlipidemia, unspecified: Secondary | ICD-10-CM

## 2018-04-09 DIAGNOSIS — I7090 Unspecified atherosclerosis: Secondary | ICD-10-CM

## 2018-04-09 NOTE — Progress Notes (Signed)
Subjective:    Patient ID: Megan Baker, female    DOB: 08/24/1941, 77 y.o.   MRN: [id number] Chief Complaint  Patient presents with  . Follow-up    Ultrasound follow up   Patient presents to review vascular studies.  The patient was last seen on March 10, 2018 for an initial evaluation for mesenteric ischemia. The patient continues to experience postprandial pain and nausea.  The patient's symptoms are stable and have not worsened. 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  Normal superior mesenteric artery findings.  The patient denies any changes in her bowel habits.  The patient denies any fever, nausea or vomiting.  Review of Systems  Constitutional: Negative.   HENT: Negative.   Eyes: Negative.   Respiratory: Negative.   Cardiovascular:       Mesenteric artery stenosis  Gastrointestinal: Negative.   Endocrine: Negative.   Genitourinary: Negative.   Musculoskeletal: Negative.   Skin: Negative.   Allergic/Immunologic: Negative.   Neurological: Negative.   Hematological: Negative.   Psychiatric/Behavioral: Negative.       Objective:   Physical Exam  Constitutional: She is oriented to person, place, and time. She appears well-developed and well-nourished. No distress.  HENT:  Head: Normocephalic and atraumatic.  Right Ear: External ear normal.  Left Ear: External ear normal.  Eyes: Pupils are equal, round, and reactive to light. Conjunctivae and EOM are normal.  Neck: Normal range of motion.  Cardiovascular: Normal rate, regular rhythm and normal heart sounds.  Pulmonary/Chest: Effort normal and breath sounds normal.  Abdominal: Soft. Bowel sounds are normal.  Musculoskeletal: Normal range of motion. She exhibits no edema.  Neurological: She is alert and oriented to person, place, and time.  Skin: Skin is warm and dry. She is not diaphoretic.  Psychiatric: She has a normal  mood and affect. Her behavior is normal. Judgment and thought content normal.  Vitals reviewed.  BP 129/75 (BP Location: Left Arm, Patient Position: Sitting)   Pulse 65   Resp 13   Ht 5' 2"  (1.575 m)   Wt 135 lb (61.2 kg)   BMI 24.69 kg/m   Past Medical History:  Diagnosis Date  . Allergic rhinitis   . Arthritis   . Autoimmune hepatitis ([place])   . Avascular necrosis of bone of hip, right ([place])   . Burning mouth syndrome   . Celiac sprue   . Connective tissue disease ([place])   . COPD (chronic obstructive pulmonary disease) ([place])   . Coronary artery disease   . Depression   . Gall stones   . Hyperlipidemia   . Hypertension   . Pelvic prolapse   . Psoriasis    Social History   Socioeconomic History  . Marital status: Widowed    Spouse name: Not on file  . Number of children: Not on file  . Years of education: Not on file  . Highest education level: Not on file  Occupational History  . Not on file  Social Needs  . Financial resource strain: Not on file  . Food insecurity:    Worry: Not on file    Inability: Not on file  . Transportation needs:    Medical: Not on file    Non-medical: Not on file  Tobacco Use  . Smoking status: Former Research scientist (life sciences)  . Smokeless tobacco: Never Used  Substance and Sexual Activity  . Alcohol use: No  . Drug use:  No  . Sexual activity: Not on file  Lifestyle  . Physical activity:    Days per week: Not on file    Minutes per session: Not on file  . Stress: Not on file  Relationships  . Social connections:    Talks on phone: Not on file    Gets together: Not on file    Attends religious service: Not on file    Active member of club or organization: Not on file    Attends meetings of clubs or organizations: Not on file    Relationship status: Not on file  . Intimate partner violence:    Fear of current or ex partner: Not on file    Emotionally abused: Not on file    Physically abused: Not on file    Forced sexual activity: Not on file    Other Topics Concern  . Not on file  Social History Narrative  . Not on file   Past Surgical History:  Procedure Laterality Date  . AMPUTATION FINGER    . CORONARY ANGIOPLASTY WITH STENT PLACEMENT    . ESOPHAGOGASTRODUODENOSCOPY (EGD) WITH PROPOFOL N/A 10/08/2016   Procedure: ESOPHAGOGASTRODUODENOSCOPY (EGD) WITH PROPOFOL;  Surgeon: Lollie Sails, MD;  Location: [facility] ENDOSCOPY;  Service: Endoscopy;  Laterality: N/A;  . EYE SURGERY    . LIVER BIOPSY    . MUSCLE BIOPSY    . TUBAL LIGATION     Family History  Problem Relation Age of Onset  . Breast cancer Neg Hx    Allergies  Allergen Reactions  . Fluorescein Other (See Comments)    intolerance  . Neo-Synephrine [Phenylephrine Hcl]   . Phenylephrine       Assessment & Plan:  Patient presents to review vascular studies.  The patient was last seen on March 10, 2018 for an initial evaluation for mesenteric ischemia. The patient continues to experience postprandial pain and nausea.  The patient's symptoms are stable and have not worsened. 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  Normal superior mesenteric artery findings.  The patient denies any changes in her bowel habits.  The patient denies any fever, nausea or vomiting.  1. Chronic mesenteric ischemia ([place]) - Stable Patient continues to experience postprandial pain and nausea. 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 Procedure, risks and benefits explained to the patient All questions answered Patient wishes to proceed  2. Essential hypertension - Stable Encouraged good control as its slows the progression of atherosclerotic disease  3. Hyperlipidemia, unspecified hyperlipidemia type -  Stable Encouraged good control as its slows the progression of atherosclerotic disease  Current Outpatient Medications on File Prior to Visit  Medication Sig Dispense Refill  . albuterol (PROVENTIL HFA;VENTOLIN HFA) 108 (90 Base) MCG/ACT inhaler Inhale into the lungs every 6 (six) hours as needed for wheezing or shortness of breath.    Marland Kitchen alendronate (FOSAMAX) 70 MG tablet Take 70 mg by mouth once a week. Take with a full glass of water on an empty stomach.    Marland Kitchen amLODipine (NORVASC) 5 MG tablet Take by mouth.    . azaTHIOprine (IMURAN) 50 MG tablet Take 50 mg by mouth daily.    Marland Kitchen buPROPion (WELLBUTRIN SR) 150 MG 12 hr tablet Take 150 mg by mouth 2 (  two) times daily.    . Calcium Carb-Cholecalciferol (CALCIUM 500+D) 500-400 MG-UNIT TABS Take 1 tablet by mouth daily.    . citalopram (CELEXA) 20 MG tablet Take 1 tablet by mouth  daily    . fluticasone (FLOVENT HFA) 110 MCG/ACT inhaler Inhale 1 puff into the lungs 2 (two) times daily.    . metoprolol succinate (TOPROL-XL) 100 MG 24 hr tablet Take 100 mg by mouth daily. Take with or immediately following a meal.    . multivitamin-iron-minerals-folic acid (CENTRUM) chewable tablet Chew 1 tablet by mouth daily.    Marland Kitchen nystatin cream (MYCOSTATIN)     . olmesartan (BENICAR) 40 MG tablet Take 40 mg by mouth daily.    Marland Kitchen omeprazole (PRILOSEC) 20 MG capsule Take 20 mg by mouth 2 (two) times daily before a meal.    . predniSONE (DELTASONE) 10 MG tablet Take 5 mg by mouth daily with breakfast.    . simvastatin (ZOCOR) 20 MG tablet Take 20 mg by mouth daily.    . traZODone (DESYREL) 50 MG tablet Take 50 mg by mouth at bedtime.    . triamterene-hydrochlorothiazide (MAXZIDE-25) 37.5-25 MG tablet Take 1 tablet by mouth daily.     No current facility-administered medications on file prior to visit.    There are no Patient Instructions on file for this visit. No follow-ups on file.  Dale Strausser A Caleel Kiner, PA-C

## 2018-04-13 ENCOUNTER — Other Ambulatory Visit (INDEPENDENT_AMBULATORY_CARE_PROVIDER_SITE_OTHER): Payer: Self-pay | Admitting: Vascular Surgery

## 2018-04-15 MED ORDER — CEFAZOLIN SODIUM-DEXTROSE 2-4 GM/100ML-% IV SOLN
2.0000 g | Freq: Once | INTRAVENOUS | Status: AC
  Administered 2018-04-16: 2 g via INTRAVENOUS

## 2018-04-16 ENCOUNTER — Encounter: Payer: Self-pay | Admitting: Vascular Surgery

## 2018-04-16 ENCOUNTER — Ambulatory Visit
Admission: RE | Admit: 2018-04-16 | Discharge: 2018-04-16 | Disposition: A | Discharge status: Home / Self Care | Payer: Medicare Other | Source: Ambulatory Visit | Attending: Vascular Surgery | Admitting: Vascular Surgery

## 2018-04-16 ENCOUNTER — Encounter
Admission: RE | Disposition: A | Discharge status: Home / Self Care | Payer: Self-pay | Source: Ambulatory Visit | Attending: Vascular Surgery

## 2018-04-16 DIAGNOSIS — M1990 Unspecified osteoarthritis, unspecified site: Secondary | ICD-10-CM | POA: Insufficient documentation

## 2018-04-16 DIAGNOSIS — K551 Chronic vascular disorders of intestine: Secondary | ICD-10-CM

## 2018-04-16 DIAGNOSIS — I10 Essential (primary) hypertension: Secondary | ICD-10-CM | POA: Insufficient documentation

## 2018-04-16 DIAGNOSIS — I774 Celiac artery compression syndrome: Secondary | ICD-10-CM | POA: Insufficient documentation

## 2018-04-16 DIAGNOSIS — Z955 Presence of coronary angioplasty implant and graft: Secondary | ICD-10-CM | POA: Diagnosis not present

## 2018-04-16 DIAGNOSIS — E785 Hyperlipidemia, unspecified: Secondary | ICD-10-CM | POA: Insufficient documentation

## 2018-04-16 DIAGNOSIS — K754 Autoimmune hepatitis: Secondary | ICD-10-CM | POA: Diagnosis not present

## 2018-04-16 DIAGNOSIS — M359 Systemic involvement of connective tissue, unspecified: Secondary | ICD-10-CM | POA: Diagnosis not present

## 2018-04-16 DIAGNOSIS — Z7951 Long term (current) use of inhaled steroids: Secondary | ICD-10-CM | POA: Insufficient documentation

## 2018-04-16 DIAGNOSIS — I2510 Atherosclerotic heart disease of native coronary artery without angina pectoris: Secondary | ICD-10-CM | POA: Insufficient documentation

## 2018-04-16 DIAGNOSIS — Z9851 Tubal ligation status: Secondary | ICD-10-CM | POA: Diagnosis not present

## 2018-04-16 DIAGNOSIS — Z87891 Personal history of nicotine dependence: Secondary | ICD-10-CM | POA: Insufficient documentation

## 2018-04-16 DIAGNOSIS — J449 Chronic obstructive pulmonary disease, unspecified: Secondary | ICD-10-CM | POA: Diagnosis not present

## 2018-04-16 DIAGNOSIS — Z9889 Other specified postprocedural states: Secondary | ICD-10-CM | POA: Insufficient documentation

## 2018-04-16 DIAGNOSIS — Z89029 Acquired absence of unspecified finger(s): Secondary | ICD-10-CM | POA: Diagnosis not present

## 2018-04-16 DIAGNOSIS — Z888 Allergy status to other drugs, medicaments and biological substances status: Secondary | ICD-10-CM | POA: Diagnosis not present

## 2018-04-16 DIAGNOSIS — Z79899 Other long term (current) drug therapy: Secondary | ICD-10-CM | POA: Insufficient documentation

## 2018-04-16 HISTORY — PX: VISCERAL ANGIOGRAPHY: CATH118276

## 2018-04-16 LAB — CREATININE, SERUM
CREATININE: 1.12 mg/dL — AB (ref 0.44–1.00)
GFR calc Af Amer: 54 mL/min — ABNORMAL LOW (ref >60)
GFR calc non Af Amer: 46 mL/min — ABNORMAL LOW (ref >60)

## 2018-04-16 LAB — BUN: BUN: 12 mg/dL (ref 6–20)

## 2018-04-16 SURGERY — VISCERAL ANGIOGRAPHY
Anesthesia: Moderate Sedation

## 2018-04-16 MED ORDER — HYDROMORPHONE HCL 1 MG/ML IJ SOLN: 1.0000 mg | Freq: Once | INTRAMUSCULAR | Status: DC | PRN

## 2018-04-16 MED ORDER — SODIUM CHLORIDE 0.9 % IV SOLN
INTRAVENOUS | Status: DC
  Administered 2018-04-16: 10:00:00 via INTRAVENOUS

## 2018-04-16 MED ORDER — MIDAZOLAM HCL 5 MG/5ML IJ SOLN
INTRAMUSCULAR | Status: AC
  Filled 2018-04-16: qty 5

## 2018-04-16 MED ORDER — SODIUM CHLORIDE 0.9 % IV SOLN
INTRAVENOUS | Status: AC | PRN
  Administered 2018-04-16: 250 mL via INTRAVENOUS

## 2018-04-16 MED ORDER — IOPAMIDOL (ISOVUE-300) INJECTION 61%
INTRAVENOUS | Status: DC | PRN
  Administered 2018-04-16: 50 mL via INTRA_ARTERIAL

## 2018-04-16 MED ORDER — MIDAZOLAM HCL 2 MG/2ML IJ SOLN
INTRAMUSCULAR | Status: DC | PRN
  Administered 2018-04-16: 2 mg via INTRAVENOUS

## 2018-04-16 MED ORDER — ASPIRIN EC 81 MG PO TBEC: 81.0000 mg | DELAYED_RELEASE_TABLET | Freq: Every day | ORAL | 2 refills | Status: AC

## 2018-04-16 MED ORDER — FENTANYL CITRATE (PF) 100 MCG/2ML IJ SOLN
INTRAMUSCULAR | Status: DC | PRN
  Administered 2018-04-16: 50 ug via INTRAVENOUS

## 2018-04-16 MED ORDER — METHYLPREDNISOLONE SODIUM SUCC 125 MG IJ SOLR: 125.0000 mg | INTRAMUSCULAR | Status: DC | PRN

## 2018-04-16 MED ORDER — FAMOTIDINE 20 MG PO TABS: 40.0000 mg | ORAL_TABLET | ORAL | Status: DC | PRN

## 2018-04-16 MED ORDER — FENTANYL CITRATE (PF) 100 MCG/2ML IJ SOLN
INTRAMUSCULAR | Status: AC
  Filled 2018-04-16: qty 2

## 2018-04-16 MED ORDER — HEPARIN SODIUM (PORCINE) 1000 UNIT/ML IJ SOLN
INTRAMUSCULAR | Status: DC | PRN
  Administered 2018-04-16: 4000 [IU] via INTRAVENOUS

## 2018-04-16 MED ORDER — ONDANSETRON HCL 4 MG/2ML IJ SOLN: 4.0000 mg | Freq: Four times a day (QID) | INTRAMUSCULAR | Status: DC | PRN

## 2018-04-16 MED ORDER — HEPARIN SODIUM (PORCINE) 1000 UNIT/ML IJ SOLN
INTRAMUSCULAR | Status: AC
  Filled 2018-04-16: qty 1

## 2018-04-16 MED ORDER — CLOPIDOGREL BISULFATE 75 MG PO TABS: 75.0000 mg | ORAL_TABLET | Freq: Every day | ORAL | 11 refills | Status: DC

## 2018-04-16 SURGICAL SUPPLY — 18 items
BALLN LUTONIX DCB 6X40X130 (BALLOONS) ×3
BALLOON LUTONIX DCB 6X40X130 (BALLOONS) ×1 IMPLANT
CANNULA 5F STIFF (CANNULA) ×3 IMPLANT
CATH BEACON 5 .035 65 KMP TIP (CATHETERS) ×3 IMPLANT
CATH PIG 70CM (CATHETERS) ×3 IMPLANT
CATH VS15FR (CATHETERS) ×3 IMPLANT
DEVICE PRESTO INFLATION (MISCELLANEOUS) ×3 IMPLANT
DEVICE STARCLOSE SE CLOSURE (Vascular Products) ×3 IMPLANT
DEVICE TORQUE .025-.038 (MISCELLANEOUS) ×3 IMPLANT
GLIDEWIRE STIFF .35X180X3 HYDR (WIRE) ×3 IMPLANT
PACK ANGIOGRAPHY (CUSTOM PROCEDURE TRAY) ×3 IMPLANT
SHEATH BRITE TIP 5FRX11 (SHEATH) ×3 IMPLANT
SHEATH PINNACLE ST 6F 45CM (SHEATH) ×3 IMPLANT
SYR MEDRAD MARK V 150ML (SYRINGE) ×3 IMPLANT
TUBING CONTRAST HIGH PRESS 72 (TUBING) ×3 IMPLANT
VALVE HEMO TOUHY BORST Y (ADAPTER) ×3 IMPLANT
WIRE J 3MM .035X145CM (WIRE) ×3 IMPLANT
WIRE MAGIC TORQUE 260C (WIRE) ×3 IMPLANT

## 2018-04-16 NOTE — Op Note (Signed)
[facility] Percutaneous Study/Intervention Procedural Note   Date: 04/16/2018  Surgeon(s): Leotis Pain, MD  Assistants: none  Pre-operative Diagnosis: 1.  Chronic mesenteric ischemia 2.  Celiac stenosis   Post-operative diagnosis:Chronic mesenteric ischemia with SMA stenosis  Procedure(s) Performed: 1. Ultrasound guidance for vascular access right femoral artery  2. Catheter placement into celiac artery and superior mesenteric artery from right femoral approach 3. Aortogram and selective angiogram of the celiac and superior mesenteric artery 4. PTA of the superior mesenteric artery with 6 mm diameter by 4 cm Lutonix drug-coated angioplasty balloon 5. StarClose closure device right femoral artery  Contrast: 50  Fluoro time: 5.6 minutes  EBL: 5 cc  Anesthesia: Approximately 30 minutes of Moderate conscious sedation using 2 mg of Versed and 50 Mcg of Fentanyl  Indications: Patient is a 77 y.o. female who has symptoms consistent with mesenteric ischemia. The patient has a duplex showing elevated velocities and what appeared to be the celiac artery. The patient is brought in for angiography for further evaluation and potential treatment. Risks and benefits are discussed and informed consent is obtained  Procedure: The patient was identified and appropriate procedural time out was performed. The patient was then placed supine on the table and prepped and draped in the usual sterile fashion. Moderate conscious sedation was administered during a face to face encounter with the patient throughout the procedure with my supervision of the RN administering medicines and monitoring the patient's vital signs, pulse oximetry, telemetry and mental status throughout from the start of the procedure until the patient was taken to the recovery room. Ultrasound was used to evaluate the right common  femoral artery. It was patent . A digital ultrasound image was acquired. A Seldinger needle was used to access the right common femoral artery under direct ultrasound guidance and a permanent image was performed. A 0.035 J wire was advanced without resistance and a 5Fr sheath was placed. Pigtail catheter was placed into the aorta and an AP aortogram was performed. This demonstrated somewhat atypical course with the celiac artery having only 2 primary branches and the SMA being upgoing before turning back down and taking a more traditional course.  The renal arteries appear to be patent with 2 right renal arteries and a single left renal artery.  We transitioned to the lateral projection to image the celiac and SMA. The lateral image demonstrated calcific plaque at the origin of the celiac and superior mesenteric arteries although the degree of stenosis was difficult to discern. The patient was given 4000 units of IV heparin.We upsized to a 6 Fr sheath.  A VS 1 catheter was used to selectively cannulate the celiac artery first. This demonstrated a minimal stenosis of the celiac artery.  I then remove the VS 1 catheter from the celiac artery and selectively cannulated the superior mesenteric artery with the VS 1 catheter.  This did have an interesting course that was straight for about a centimeter and then up going for about a centimeter before turning downward.  Imaging was difficult to completely discern, but in a steep oblique there appeared to be about a 70 to 80% stenosis of the superior mesenteric artery just beyond its origin. Based on her symptoms and these findings, I elected to treat the superior mesenteric artery to try to improve the patient's clinical course. I crossed the lesion without difficulty with a Glidewire and then advance the VS 1 catheter beyond the stenosis.  I then exchanged for a Magic torque wire  and remove the Glidewire. I then used a 6 mm diameter x 40 mm length Lutonix  drug-coated angioplasty balloon to perform percutaneous transluminal angioplasty of the superior mesenteric artery. I inflated the balloon to 12 Atm and held the inflation for 1 minute. On completion angiogram following this angioplasty, 15-20 % residual stenosis was identified. At this point, I elected to terminate the procedure. The diagnostic catheter was removed. StarClose closure device was deployed in usual fashion with excellent hemostatic result. The patient was taken to the recovery room in stable condition having tolerated the procedure well.    Disposition: Patient was taken to the recovery room in stable condition having tolerated the procedure well.  Complications: None  Leotis Pain 04/16/2018 12:33 PM   This note was created with Dragon Medical transcription system. Any errors in dictation are purely unintentional.

## 2018-04-16 NOTE — Progress Notes (Signed)
Patient remains clinically stable post mesenteric angiogram per Dr Lucky Cowboy. Daughter at bedside. Denies complaints. No bleeding nor hematoma at right groin site. Sinus rhythm per monitor.Dr Lucky Cowboy out to speak with patient and daughter with questions answered. Discharge instructions given with questions answered.

## 2018-04-16 NOTE — H&P (Signed)
[facility] History & Physical Update  The patient was interviewed and re-examined.  The patient's previous History and Physical has been reviewed and is unchanged.  There is no change in the plan of care. We plan to proceed with the scheduled procedure.  Leotis Pain, MD  04/16/2018, 10:08 AM

## 2018-04-16 NOTE — Discharge Instructions (Signed)

## 2018-04-20 ENCOUNTER — Encounter: Payer: Self-pay | Admitting: Vascular Surgery

## 2018-05-15 ENCOUNTER — Ambulatory Visit (INDEPENDENT_AMBULATORY_CARE_PROVIDER_SITE_OTHER): Payer: Medicare Other | Admitting: Vascular Surgery

## 2018-05-15 ENCOUNTER — Encounter (INDEPENDENT_AMBULATORY_CARE_PROVIDER_SITE_OTHER): Payer: Self-pay | Admitting: Vascular Surgery

## 2018-05-15 VITALS — BP 122/71 | HR 67 | Resp 16 | Ht 62.0 in | Wt 132.8 lb

## 2018-05-15 DIAGNOSIS — K551 Chronic vascular disorders of intestine: Secondary | ICD-10-CM

## 2018-05-15 DIAGNOSIS — E785 Hyperlipidemia, unspecified: Secondary | ICD-10-CM

## 2018-05-15 DIAGNOSIS — I10 Essential (primary) hypertension: Secondary | ICD-10-CM

## 2018-05-15 NOTE — Progress Notes (Signed)
Subjective:    Patient ID: Megan Baker, female    DOB: 03/16/1941, 77 y.o.   MRN: [id number] Chief Complaint  Patient presents with  . Follow-up    [facility] 4-6wk Mesentric   Patient presents for her first post procedure follow-up.  The patient is status post a mesenteric angiogram with PTA of the superior mesenteric artery on April 09, 2018.  The patient presents today without complaint.  The patient presents today with an improvement in her abdominal pain after eating.  She denies any nausea or vomiting.  Patient denies any fever, nausea vomiting.  Patient is pleased with the improvement in her symptoms.  Patient denies any issues with her bowel habits.  Review of Systems  Constitutional: Negative.   HENT: Negative.   Eyes: Negative.   Respiratory: Negative.   Cardiovascular: Negative.   Gastrointestinal: Negative.   Endocrine: Negative.   Genitourinary: Negative.   Musculoskeletal: Negative.   Skin: Negative.   Allergic/Immunologic: Negative.   Neurological: Negative.   Hematological: Negative.   Psychiatric/Behavioral: Negative.       Objective:   Physical Exam  Constitutional: She is oriented to person, place, and time. She appears well-developed and well-nourished. No distress.  HENT:  Head: Normocephalic and atraumatic.  Right Ear: External ear normal.  Left Ear: External ear normal.  Eyes: Pupils are equal, round, and reactive to light. Conjunctivae and EOM are normal.  Neck: Normal range of motion.  Cardiovascular: Normal rate, regular rhythm, normal heart sounds and intact distal pulses.  Pulses:      Radial pulses are 2+ on the right side, and 2+ on the left side.  Pulmonary/Chest: Effort normal and breath sounds normal.  Abdominal: Soft. Bowel sounds are normal. She exhibits no distension. There is no tenderness. There is no guarding.  Musculoskeletal: Normal range of motion.  Neurological: She is alert and oriented to person, place, and time.  Skin: Skin is  warm and dry. She is not diaphoretic.  Psychiatric: She has a normal mood and affect. Her behavior is normal. Judgment and thought content normal.  Vitals reviewed.  BP 122/71 (BP Location: Right Arm)   Pulse 67   Resp 16   Ht 5' 2"  (1.575 m)   Wt 132 lb 12.8 oz (60.2 kg)   BMI 24.29 kg/m   Past Medical History:  Diagnosis Date  . Allergic rhinitis   . Arthritis   . Autoimmune hepatitis ([place])   . Avascular necrosis of bone of hip, right ([place])   . Burning mouth syndrome   . Celiac sprue   . Connective tissue disease ([place])   . COPD (chronic obstructive pulmonary disease) ([place])   . Coronary artery disease   . Depression   . Gall stones   . Hyperlipidemia   . Hypertension   . Pelvic prolapse   . Psoriasis    Social History   Socioeconomic History  . Marital status: Widowed    Spouse name: Not on file  . Number of children: Not on file  . Years of education: Not on file  . Highest education level: Not on file  Occupational History  . Not on file  Social Needs  . Financial resource strain: Not on file  . Food insecurity:    Worry: Not on file    Inability: Not on file  . Transportation needs:    Medical: Not on file    Non-medical: Not on file  Tobacco Use  . Smoking status: Former Research scientist (life sciences)  .  Smokeless tobacco: Never Used  Substance and Sexual Activity  . Alcohol use: No  . Drug use: No  . Sexual activity: Not on file  Lifestyle  . Physical activity:    Days per week: Not on file    Minutes per session: Not on file  . Stress: Not on file  Relationships  . Social connections:    Talks on phone: Not on file    Gets together: Not on file    Attends religious service: Not on file    Active member of club or organization: Not on file    Attends meetings of clubs or organizations: Not on file    Relationship status: Not on file  . Intimate partner violence:    Fear of current or ex partner: Not on file    Emotionally abused: Not on file    Physically abused:  Not on file    Forced sexual activity: Not on file  Other Topics Concern  . Not on file  Social History Narrative  . Not on file   Past Surgical History:  Procedure Laterality Date  . AMPUTATION FINGER    . CORONARY ANGIOPLASTY WITH STENT PLACEMENT    . ESOPHAGOGASTRODUODENOSCOPY (EGD) WITH PROPOFOL N/A 10/08/2016   Procedure: ESOPHAGOGASTRODUODENOSCOPY (EGD) WITH PROPOFOL;  Surgeon: Lollie Sails, MD;  Location: [facility] ENDOSCOPY;  Service: Endoscopy;  Laterality: N/A;  . EYE SURGERY    . LIVER BIOPSY    . MUSCLE BIOPSY    . TUBAL LIGATION    . VISCERAL ANGIOGRAPHY N/A 04/16/2018   Procedure: VISCERAL ANGIOGRAPHY;  Surgeon: Algernon Huxley, MD;  Location: [place] CV LAB;  Service: Cardiovascular;  Laterality: N/A;   Family History  Problem Relation Age of Onset  . Breast cancer Neg Hx    Allergies  Allergen Reactions  . Fluorescein Other (See Comments)    intolerance  . Neo-Synephrine [Phenylephrine Hcl]   . Phenylephrine       Assessment & Plan:  Patient presents for her first post procedure follow-up.  The patient is status post a mesenteric angiogram with PTA of the superior mesenteric artery on April 09, 2018.  The patient presents today without complaint.  The patient presents today with an improvement in her abdominal pain after eating.  She denies any nausea or vomiting.  Patient denies any fever, nausea vomiting.  Patient is pleased with the improvement in her symptoms.  Patient denies any issues with her bowel habits.  1. Chronic mesenteric ischemia ([facility]) - Stable Patient presents for her first post procedure follow-up The patient is status post a mesenteric angiogram with PTA of the superior mesenteric artery on April 09, 2017. The patient notes an improvement in her symptoms.  The patient no longer experiences postprandial pain.  She denies any nausea vomiting or changes in her bowel habit PI have discussed with the patient at length the risk factors for and  pathogenesis of atherosclerotic disease and encouraged a healthy diet, regular exercise regimen and blood pressure / glucose control. The patient was encouraged to call the office in the interim if she experiences post prandial pain, weight loss, fear of eating, nausea, vomiting, constipation or diarrhea. The patient expresses their understanding.atient should follow-up in 6 months for mesenteric duplex  - VAS [place] MESENTERIC DUPLEX; Future  2. Hyperlipidemia, unspecified hyperlipidemia type - Stable Encouraged good control as its slows the progression of atherosclerotic disease  3. Essential hypertension - Stable Encouraged good control as its slows the progression of atherosclerotic  disease  Current Outpatient Medications on File Prior to Visit  Medication Sig Dispense Refill  . albuterol (PROVENTIL HFA;VENTOLIN HFA) 108 (90 Base) MCG/ACT inhaler Inhale into the lungs every 6 (six) hours as needed for wheezing or shortness of breath.    Marland Kitchen alendronate (FOSAMAX) 70 MG tablet Take 70 mg by mouth once a week. Take with a full glass of water on an empty stomach.    Marland Kitchen amLODipine (NORVASC) 5 MG tablet Take by mouth.    Marland Kitchen aspirin EC 81 MG tablet Take 1 tablet (81 mg total) by mouth daily. 150 tablet 2  . azaTHIOprine (IMURAN) 50 MG tablet Take 50 mg by mouth daily.    Marland Kitchen buPROPion (WELLBUTRIN SR) 150 MG 12 hr tablet Take 150 mg by mouth 2 (two) times daily.    . Calcium Carb-Cholecalciferol (CALCIUM 500+D) 500-400 MG-UNIT TABS Take 1 tablet by mouth daily.    . citalopram (CELEXA) 20 MG tablet Take 1 tablet by mouth  daily    . clopidogrel (PLAVIX) 75 MG tablet Take 1 tablet (75 mg total) by mouth daily. 30 tablet 11  . fluticasone (FLOVENT HFA) 110 MCG/ACT inhaler Inhale 1 puff into the lungs 2 (two) times daily.    . metoprolol succinate (TOPROL-XL) 100 MG 24 hr tablet Take 100 mg by mouth daily. Take with or immediately following a meal.    . multivitamin-iron-minerals-folic acid (CENTRUM)  chewable tablet Chew 1 tablet by mouth daily.    Marland Kitchen nystatin cream (MYCOSTATIN)     . olmesartan (BENICAR) 40 MG tablet Take 40 mg by mouth daily.    Marland Kitchen omeprazole (PRILOSEC) 20 MG capsule Take 20 mg by mouth 2 (two) times daily before a meal.    . predniSONE (DELTASONE) 10 MG tablet Take 5 mg by mouth daily with breakfast.    . simvastatin (ZOCOR) 20 MG tablet Take 20 mg by mouth daily.    . traZODone (DESYREL) 50 MG tablet Take 50 mg by mouth at bedtime.    . triamterene-hydrochlorothiazide (MAXZIDE-25) 37.5-25 MG tablet Take 1 tablet by mouth daily.     No current facility-administered medications on file prior to visit.    There are no Patient Instructions on file for this visit. No follow-ups on file.  Shontae Rosiles A Rucker Pr[place]on, PA-C

## 2018-08-24 ENCOUNTER — Ambulatory Visit: Payer: Medicare Other

## 2018-09-03 ENCOUNTER — Ambulatory Visit: Admission: RE | Admit: 2018-09-03 | Payer: Medicare Other | Source: Ambulatory Visit

## 2018-09-17 ENCOUNTER — Ambulatory Visit
Admission: RE | Admit: 2018-09-17 | Discharge: 2018-09-17 | Disposition: A | Discharge status: Home / Self Care | Payer: Medicare Other | Source: Ambulatory Visit | Attending: Specialist | Admitting: Specialist

## 2018-09-17 DIAGNOSIS — R911 Solitary pulmonary nodule: Secondary | ICD-10-CM | POA: Diagnosis not present

## 2018-09-17 DIAGNOSIS — J479 Bronchiectasis, uncomplicated: Secondary | ICD-10-CM | POA: Insufficient documentation

## 2018-09-17 DIAGNOSIS — K449 Diaphragmatic hernia without obstruction or gangrene: Secondary | ICD-10-CM | POA: Diagnosis not present

## 2018-09-17 DIAGNOSIS — I700 Atherosclerosis of aorta: Secondary | ICD-10-CM | POA: Diagnosis not present

## 2018-09-17 DIAGNOSIS — I2510 Atherosclerotic heart disease of native coronary artery without angina pectoris: Secondary | ICD-10-CM | POA: Diagnosis not present

## 2018-09-27 ENCOUNTER — Emergency Department: Payer: Medicare Other

## 2018-09-27 ENCOUNTER — Observation Stay
Admission: EM | Admit: 2018-09-27 | Discharge: 2018-09-29 | Disposition: A | Discharge status: Skilled Nursing Facility | Payer: Medicare Other | Attending: Internal Medicine | Admitting: Internal Medicine

## 2018-09-27 ENCOUNTER — Other Ambulatory Visit: Payer: Self-pay

## 2018-09-27 ENCOUNTER — Encounter: Payer: Self-pay | Admitting: Emergency Medicine

## 2018-09-27 DIAGNOSIS — Z7902 Long term (current) use of antithrombotics/antiplatelets: Secondary | ICD-10-CM | POA: Diagnosis not present

## 2018-09-27 DIAGNOSIS — E876 Hypokalemia: Secondary | ICD-10-CM | POA: Diagnosis not present

## 2018-09-27 DIAGNOSIS — Z955 Presence of coronary angioplasty implant and graft: Secondary | ICD-10-CM | POA: Diagnosis not present

## 2018-09-27 DIAGNOSIS — I119 Hypertensive heart disease without heart failure: Secondary | ICD-10-CM | POA: Insufficient documentation

## 2018-09-27 DIAGNOSIS — M1990 Unspecified osteoarthritis, unspecified site: Secondary | ICD-10-CM | POA: Diagnosis not present

## 2018-09-27 DIAGNOSIS — K754 Autoimmune hepatitis: Secondary | ICD-10-CM | POA: Insufficient documentation

## 2018-09-27 DIAGNOSIS — Z7982 Long term (current) use of aspirin: Secondary | ICD-10-CM | POA: Insufficient documentation

## 2018-09-27 DIAGNOSIS — F419 Anxiety disorder, unspecified: Secondary | ICD-10-CM | POA: Insufficient documentation

## 2018-09-27 DIAGNOSIS — W010XXA Fall on same level from slipping, tripping and stumbling without subsequent striking against object, initial encounter: Secondary | ICD-10-CM | POA: Insufficient documentation

## 2018-09-27 DIAGNOSIS — Z23 Encounter for immunization: Secondary | ICD-10-CM | POA: Insufficient documentation

## 2018-09-27 DIAGNOSIS — R296 Repeated falls: Secondary | ICD-10-CM | POA: Diagnosis not present

## 2018-09-27 DIAGNOSIS — Z881 Allergy status to other antibiotic agents status: Secondary | ICD-10-CM | POA: Insufficient documentation

## 2018-09-27 DIAGNOSIS — Z888 Allergy status to other drugs, medicaments and biological substances status: Secondary | ICD-10-CM | POA: Insufficient documentation

## 2018-09-27 DIAGNOSIS — Z7951 Long term (current) use of inhaled steroids: Secondary | ICD-10-CM | POA: Insufficient documentation

## 2018-09-27 DIAGNOSIS — S0990XA Unspecified injury of head, initial encounter: Secondary | ICD-10-CM | POA: Diagnosis not present

## 2018-09-27 DIAGNOSIS — Z87891 Personal history of nicotine dependence: Secondary | ICD-10-CM | POA: Diagnosis not present

## 2018-09-27 DIAGNOSIS — Z89029 Acquired absence of unspecified finger(s): Secondary | ICD-10-CM | POA: Insufficient documentation

## 2018-09-27 DIAGNOSIS — F329 Major depressive disorder, single episode, unspecified: Secondary | ICD-10-CM | POA: Diagnosis not present

## 2018-09-27 DIAGNOSIS — Z820 Family history of epilepsy and other diseases of the nervous system: Secondary | ICD-10-CM | POA: Insufficient documentation

## 2018-09-27 DIAGNOSIS — E785 Hyperlipidemia, unspecified: Secondary | ICD-10-CM | POA: Diagnosis not present

## 2018-09-27 DIAGNOSIS — Z91040 Latex allergy status: Secondary | ICD-10-CM | POA: Insufficient documentation

## 2018-09-27 DIAGNOSIS — I2510 Atherosclerotic heart disease of native coronary artery without angina pectoris: Secondary | ICD-10-CM | POA: Insufficient documentation

## 2018-09-27 DIAGNOSIS — S329XXA Fracture of unspecified parts of lumbosacral spine and pelvis, initial encounter for closed fracture: Secondary | ICD-10-CM | POA: Diagnosis present

## 2018-09-27 DIAGNOSIS — K900 Celiac disease: Secondary | ICD-10-CM | POA: Diagnosis not present

## 2018-09-27 DIAGNOSIS — J309 Allergic rhinitis, unspecified: Secondary | ICD-10-CM | POA: Insufficient documentation

## 2018-09-27 DIAGNOSIS — S32511A Fracture of superior rim of right pubis, initial encounter for closed fracture: Principal | ICD-10-CM | POA: Insufficient documentation

## 2018-09-27 DIAGNOSIS — M87851 Other osteonecrosis, right femur: Secondary | ICD-10-CM | POA: Diagnosis not present

## 2018-09-27 DIAGNOSIS — J449 Chronic obstructive pulmonary disease, unspecified: Secondary | ICD-10-CM | POA: Insufficient documentation

## 2018-09-27 DIAGNOSIS — R208 Other disturbances of skin sensation: Secondary | ICD-10-CM | POA: Diagnosis not present

## 2018-09-27 DIAGNOSIS — Z803 Family history of malignant neoplasm of breast: Secondary | ICD-10-CM | POA: Insufficient documentation

## 2018-09-27 DIAGNOSIS — K449 Diaphragmatic hernia without obstruction or gangrene: Secondary | ICD-10-CM | POA: Insufficient documentation

## 2018-09-27 DIAGNOSIS — L409 Psoriasis, unspecified: Secondary | ICD-10-CM | POA: Diagnosis not present

## 2018-09-27 DIAGNOSIS — Z79899 Other long term (current) drug therapy: Secondary | ICD-10-CM | POA: Insufficient documentation

## 2018-09-27 DIAGNOSIS — S32591A Other specified fracture of right pubis, initial encounter for closed fracture: Secondary | ICD-10-CM | POA: Diagnosis present

## 2018-09-27 LAB — URINALYSIS, COMPLETE (UACMP) WITH MICROSCOPIC
Bacteria, UA: NONE SEEN
Bilirubin Urine: NEGATIVE
Glucose, UA: NEGATIVE mg/dL
Hgb urine dipstick: NEGATIVE
Ketones, ur: NEGATIVE mg/dL
Nitrite: NEGATIVE
PH: 5 (ref 5.0–8.0)
PROTEIN: NEGATIVE mg/dL
Specific Gravity, Urine: 1.011 (ref 1.005–1.030)

## 2018-09-27 LAB — BASIC METABOLIC PANEL
Anion gap: 9 (ref 5–15)
BUN: 9 mg/dL (ref 8–23)
CO2: 29 mmol/L (ref 22–32)
CREATININE: 1.08 mg/dL — AB (ref 0.44–1.00)
Calcium: 9.6 mg/dL (ref 8.9–10.3)
Chloride: 99 mmol/L (ref 98–111)
GFR calc Af Amer: 56 mL/min — ABNORMAL LOW (ref >60)
GFR, EST NON AFRICAN AMERICAN: 49 mL/min — AB (ref >60)
GLUCOSE: 91 mg/dL (ref 70–99)
Potassium: 3.4 mmol/L — ABNORMAL LOW (ref 3.5–5.1)
SODIUM: 137 mmol/L (ref 135–145)

## 2018-09-27 LAB — CBC
HEMATOCRIT: 36.7 % (ref 35.0–47.0)
Hemoglobin: 12.4 g/dL (ref 12.0–16.0)
MCH: 33.9 pg (ref 26.0–34.0)
MCHC: 33.9 g/dL (ref 32.0–36.0)
MCV: 99.8 fL (ref 80.0–100.0)
Platelets: 419 10*3/uL (ref 150–440)
RBC: 3.67 MIL/uL — ABNORMAL LOW (ref 3.80–5.20)
RDW: 13 % (ref 11.5–14.5)
WBC: 8.3 10*3/uL (ref 3.6–11.0)

## 2018-09-27 MED ORDER — ONDANSETRON HCL 4 MG PO TABS: 4.0000 mg | ORAL_TABLET | Freq: Four times a day (QID) | ORAL | Status: DC | PRN

## 2018-09-27 MED ORDER — AMLODIPINE BESYLATE 5 MG PO TABS
5.0000 mg | ORAL_TABLET | Freq: Every day | ORAL | Status: DC
  Filled 2018-09-27: qty 1

## 2018-09-27 MED ORDER — OXYCODONE HCL 5 MG PO TABS
5.0000 mg | ORAL_TABLET | ORAL | Status: AC
  Administered 2018-09-27: 5 mg via ORAL
  Filled 2018-09-27: qty 1

## 2018-09-27 MED ORDER — TRAZODONE HCL 50 MG PO TABS
50.0000 mg | ORAL_TABLET | Freq: Every day | ORAL | Status: DC
  Administered 2018-09-27 – 2018-09-28 (×2): 50 mg via ORAL
  Filled 2018-09-27 (×2): qty 1

## 2018-09-27 MED ORDER — IRBESARTAN 150 MG PO TABS
150.0000 mg | ORAL_TABLET | Freq: Every day | ORAL | Status: DC
  Filled 2018-09-27: qty 1

## 2018-09-27 MED ORDER — BUDESONIDE 0.25 MG/2ML IN SUSP
2.0000 mL | Freq: Two times a day (BID) | RESPIRATORY_TRACT | Status: DC
  Administered 2018-09-28 – 2018-09-29 (×3): 0.25 mg via RESPIRATORY_TRACT
  Filled 2018-09-27 (×3): qty 2

## 2018-09-27 MED ORDER — TRIAMTERENE-HCTZ 37.5-25 MG PO TABS
1.0000 | ORAL_TABLET | Freq: Every day | ORAL | Status: DC
  Filled 2018-09-27 (×2): qty 1

## 2018-09-27 MED ORDER — PREDNISONE 10 MG PO TABS
5.0000 mg | ORAL_TABLET | Freq: Every day | ORAL | Status: DC
  Administered 2018-09-28: 5 mg via ORAL
  Filled 2018-09-27 (×2): qty 1

## 2018-09-27 MED ORDER — PANTOPRAZOLE SODIUM 40 MG PO TBEC
40.0000 mg | DELAYED_RELEASE_TABLET | Freq: Every day | ORAL | Status: DC
  Administered 2018-09-27 – 2018-09-29 (×3): 40 mg via ORAL
  Filled 2018-09-27 (×3): qty 1

## 2018-09-27 MED ORDER — METOPROLOL SUCCINATE ER 50 MG PO TB24
100.0000 mg | ORAL_TABLET | Freq: Every day | ORAL | Status: DC
  Administered 2018-09-28 – 2018-09-29 (×2): 100 mg via ORAL
  Filled 2018-09-27 (×3): qty 2

## 2018-09-27 MED ORDER — POTASSIUM CHLORIDE CRYS ER 20 MEQ PO TBCR
40.0000 meq | EXTENDED_RELEASE_TABLET | Freq: Once | ORAL | Status: AC
  Administered 2018-09-27: 40 meq via ORAL
  Filled 2018-09-27: qty 2

## 2018-09-27 MED ORDER — CALCIUM CARBONATE-VITAMIN D 500-200 MG-UNIT PO TABS
1.0000 | ORAL_TABLET | Freq: Every day | ORAL | Status: DC
  Administered 2018-09-28 – 2018-09-29 (×2): 1 via ORAL
  Filled 2018-09-27 (×3): qty 1

## 2018-09-27 MED ORDER — BUPROPION HCL ER (SR) 150 MG PO TB12
150.0000 mg | ORAL_TABLET | Freq: Two times a day (BID) | ORAL | Status: DC
  Administered 2018-09-27 – 2018-09-28 (×2): 150 mg via ORAL
  Filled 2018-09-27 (×4): qty 1

## 2018-09-27 MED ORDER — AZATHIOPRINE 50 MG PO TABS
50.0000 mg | ORAL_TABLET | Freq: Every day | ORAL | Status: DC
  Administered 2018-09-28 – 2018-09-29 (×2): 50 mg via ORAL
  Filled 2018-09-27 (×3): qty 1

## 2018-09-27 MED ORDER — ADULT MULTIVITAMIN W/MINERALS CH
1.0000 | ORAL_TABLET | Freq: Every day | ORAL | Status: DC
  Administered 2018-09-28 – 2018-09-29 (×2): 1 via ORAL
  Filled 2018-09-27 (×2): qty 1

## 2018-09-27 MED ORDER — ACETAMINOPHEN 325 MG PO TABS
650.0000 mg | ORAL_TABLET | Freq: Four times a day (QID) | ORAL | Status: DC | PRN
  Administered 2018-09-27 – 2018-09-28 (×2): 650 mg via ORAL
  Filled 2018-09-27 (×2): qty 2

## 2018-09-27 MED ORDER — MORPHINE SULFATE (PF) 2 MG/ML IV SOLN: 2.0000 mg | INTRAVENOUS | Status: DC | PRN

## 2018-09-27 MED ORDER — ENOXAPARIN SODIUM 40 MG/0.4ML ~~LOC~~ SOLN
40.0000 mg | SUBCUTANEOUS | Status: DC
  Administered 2018-09-27 – 2018-09-29 (×3): 40 mg via SUBCUTANEOUS
  Filled 2018-09-27 (×3): qty 0.4

## 2018-09-27 MED ORDER — OXYCODONE HCL 5 MG PO TABS
5.0000 mg | ORAL_TABLET | ORAL | Status: DC | PRN
  Administered 2018-09-28 – 2018-09-29 (×2): 5 mg via ORAL
  Filled 2018-09-27 (×2): qty 1

## 2018-09-27 MED ORDER — SODIUM CHLORIDE 0.9 % IV SOLN
INTRAVENOUS | Status: AC
  Administered 2018-09-27 – 2018-09-28 (×2): via INTRAVENOUS

## 2018-09-27 MED ORDER — SIMVASTATIN 20 MG PO TABS
20.0000 mg | ORAL_TABLET | Freq: Every day | ORAL | Status: DC
  Administered 2018-09-27 – 2018-09-28 (×2): 20 mg via ORAL
  Filled 2018-09-27 (×3): qty 1

## 2018-09-27 MED ORDER — ALBUTEROL SULFATE (2.5 MG/3ML) 0.083% IN NEBU: 3.0000 mL | INHALATION_SOLUTION | Freq: Four times a day (QID) | RESPIRATORY_TRACT | Status: DC | PRN

## 2018-09-27 MED ORDER — FENTANYL CITRATE (PF) 100 MCG/2ML IJ SOLN
25.0000 ug | Freq: Once | INTRAMUSCULAR | Status: AC
  Administered 2018-09-27: 25 ug via INTRAVENOUS
  Filled 2018-09-27: qty 2

## 2018-09-27 MED ORDER — ONDANSETRON HCL 4 MG/2ML IJ SOLN: 4.0000 mg | Freq: Four times a day (QID) | INTRAMUSCULAR | Status: DC | PRN

## 2018-09-27 MED ORDER — CENTRUM PO CHEW
1.0000 | CHEWABLE_TABLET | Freq: Every day | ORAL | Status: DC
  Filled 2018-09-27: qty 1

## 2018-09-27 MED ORDER — CLOPIDOGREL BISULFATE 75 MG PO TABS
75.0000 mg | ORAL_TABLET | Freq: Every day | ORAL | Status: DC
  Administered 2018-09-27 – 2018-09-29 (×3): 75 mg via ORAL
  Filled 2018-09-27 (×3): qty 1

## 2018-09-27 MED ORDER — ACETAMINOPHEN 650 MG RE SUPP: 650.0000 mg | Freq: Four times a day (QID) | RECTAL | Status: DC | PRN

## 2018-09-27 MED ORDER — INFLUENZA VAC SPLIT HIGH-DOSE 0.5 ML IM SUSY
0.5000 mL | PREFILLED_SYRINGE | INTRAMUSCULAR | Status: AC
  Administered 2018-09-29: 0.5 mL via INTRAMUSCULAR
  Filled 2018-09-27: qty 0.5

## 2018-09-27 MED ORDER — CITALOPRAM HYDROBROMIDE 20 MG PO TABS
20.0000 mg | ORAL_TABLET | Freq: Every day | ORAL | Status: DC
  Administered 2018-09-27 – 2018-09-29 (×3): 20 mg via ORAL
  Filled 2018-09-27 (×3): qty 1

## 2018-09-27 MED ORDER — ASPIRIN EC 81 MG PO TBEC
81.0000 mg | DELAYED_RELEASE_TABLET | Freq: Every day | ORAL | Status: DC
  Administered 2018-09-27 – 2018-09-29 (×3): 81 mg via ORAL
  Filled 2018-09-27 (×3): qty 1

## 2018-09-27 NOTE — Plan of Care (Signed)
  Problem: Education: Goal: Knowledge of General Education information will improve Description: Including pain rating scale, medication(s)/side effects and non-pharmacologic comfort measures Outcome: Progressing   Problem: Clinical Measurements: Goal: Ability to maintain clinical measurements within normal limits will improve Outcome: Progressing   

## 2018-09-27 NOTE — ED Triage Notes (Signed)
Pt reports tripped and fell this morning. Unable to bear weight on right leg. Pain to right hip. Has chronic pain but can normally bear weight. No rotation or shortening.

## 2018-09-27 NOTE — ED Provider Notes (Signed)
[facility] Emergency Department Provider Note   ____________________________________________   First MD Initiated Contact with Patient 09/27/18 1120     (approximate)  I have reviewed the triage vital signs and the nursing notes.   HISTORY  Chief Complaint Fall    HPI Megan Baker is a 77 y.o. female presents for evaluation of a fall  Patient reports that she falls frequently.  She often slipped on a slippery wood floors in her house, and she reports she fell about a week ago and struck her head with a large knot on the back of her head, but was able to have help up in go about her normal day using her walker.  Today, she is slipped on the floor getting up from a chair and is been experiencing notable pain across her right lower pelvis   Denies hitting her head today, did strike it a week ago.  No neck pain.  Denies other injuries.  Reports she slipped and fell.  No recent illnesses no chest pain or trouble breathing  Past Medical History:  Diagnosis Date  . Allergic rhinitis   . Arthritis   . Autoimmune hepatitis ([place])   . Avascular necrosis of bone of hip, right ([place])   . Burning mouth syndrome   . Celiac sprue   . Connective tissue disease ([place])   . COPD (chronic obstructive pulmonary disease) ([place])   . Coronary artery disease   . Depression   . Gall stones   . Hyperlipidemia   . Hypertension   . Pelvic prolapse   . Psoriasis     Patient Active Problem List   Diagnosis Date Noted  . Pelvic fracture ([employer]) 09/27/2018  . Hyperlipidemia 03/10/2018  . Hypertension 03/10/2018  . Chronic mesenteric ischemia ([place]) 03/10/2018    Past Surgical History:  Procedure Laterality Date  . AMPUTATION FINGER    . CORONARY ANGIOPLASTY WITH STENT PLACEMENT    . ESOPHAGOGASTRODUODENOSCOPY (EGD) WITH PROPOFOL N/A 10/08/2016   Procedure: ESOPHAGOGASTRODUODENOSCOPY (EGD) WITH PROPOFOL;  Surgeon: Lollie Sails, MD;  Location: [facility] ENDOSCOPY;   Service: Endoscopy;  Laterality: N/A;  . EYE SURGERY    . LIVER BIOPSY    . MUSCLE BIOPSY    . TUBAL LIGATION    . VISCERAL ANGIOGRAPHY N/A 04/16/2018   Procedure: VISCERAL ANGIOGRAPHY;  Surgeon: Algernon Huxley, MD;  Location: [facility];  Service: Cardiovascular;  Laterality: N/A;    Prior to Admission medications   Medication Sig Start Date End Date Taking? Authorizing Provider  albuterol (PROVENTIL HFA;VENTOLIN HFA) 108 (90 Base) MCG/ACT inhaler Inhale into the lungs every 6 (six) hours as needed for wheezing or shortness of breath.   Yes [provider]  alendronate (FOSAMAX) 70 MG tablet Take 70 mg by mouth once a week. Take with a full glass of water on an empty stomach.   Yes [provider]  amLODipine (NORVASC) 5 MG tablet Take 5 mg by mouth daily.  09/02/12  Yes [provider]  aspirin EC 81 MG tablet Take 1 tablet (81 mg total) by mouth daily. 04/16/18  Yes Dew, Erskine Squibb, MD  azaTHIOprine (IMURAN) 50 MG tablet Take 50 mg by mouth daily.   Yes [provider]  buPROPion (WELLBUTRIN XL) 300 MG 24 hr tablet Take 300 mg by mouth daily.   Yes [provider]  calcium carbonate (OSCAL) 1500 (600 Ca) MG TABS tablet Take 1,500 mg by mouth 2 (two) times daily with a  meal.   Yes [provider]  clopidogrel (PLAVIX) 75 MG tablet Take 1 tablet (75 mg total) by mouth daily. 04/16/18  Yes Dew, Erskine Squibb, MD  fluticasone (FLOVENT HFA) 110 MCG/ACT inhaler Inhale 1 puff into the lungs 2 (two) times daily.   Yes [provider]  metoprolol succinate (TOPROL-XL) 100 MG 24 hr tablet Take 100 mg by mouth daily. Take with or immediately following a meal.   Yes [provider]  multivitamin-iron-minerals-folic acid (CENTRUM) chewable tablet Chew 1 tablet by mouth daily.   Yes [provider]  nystatin cream (MYCOSTATIN)  07/21/15  Yes [provider]  olmesartan (BENICAR) 40 MG tablet Take 40 mg by mouth daily.    Yes [provider]  omeprazole (PRILOSEC) 20 MG capsule Take 20 mg by mouth 2 (two) times daily before a meal.   Yes [provider]  predniSONE (DELTASONE) 10 MG tablet Take 5 mg by mouth daily with breakfast.   Yes [provider]  simvastatin (ZOCOR) 20 MG tablet Take 20 mg by mouth daily.   Yes [provider]  traZODone (DESYREL) 50 MG tablet Take 50 mg by mouth at bedtime.   Yes [provider]  triamterene-hydrochlorothiazide (MAXZIDE-25) 37.5-25 MG tablet Take 1 tablet by mouth daily.   Yes [provider]  vitamin B-12 (CYANOCOBALAMIN) 1000 MCG tablet Take 1,000 mcg by mouth daily.   Yes [provider]  buPROPion (WELLBUTRIN SR) 150 MG 12 hr tablet Take 150 mg by mouth 2 (two) times daily.    [provider]  Calcium Carb-Cholecalciferol (CALCIUM 500+D) 500-400 MG-UNIT TABS Take 1 tablet by mouth daily.    [provider]  citalopram (CELEXA) 20 MG tablet Take 1 tablet by mouth  daily 04/27/15   [provider]    Allergies Fluorescein; Latex; Neo-synephrine [phenylephrine hcl]; Phenylephrine; and Betadine [povidone iodine]  Family History  Problem Relation Age of Onset  . Dementia Mother   . Dementia Sister   . Breast cancer Neg Hx     Social History Social History   Tobacco Use  . Smoking status: Former Research scientist (life sciences)  . Smokeless tobacco: Never Used  Substance Use Topics  . Alcohol use: No  . Drug use: No    Review of Systems Constitutional: No fever/chills Eyes: No visual changes. ENT: No sore throat. Cardiovascular: Denies chest pain. Respiratory: Denies shortness of breath. Gastrointestinal: No abdominal pain.   Genitourinary: Negative for dysuria. Musculoskeletal: Negative for back pain.  See HPI, lots of pain right at her right groin site when she moves Skin: Negative for rash. Neurological: Negative for headaches, areas of focal weakness or  numbness.    ____________________________________________   PHYSICAL EXAM:  VITAL SIGNS: ED Triage Vitals  Enc Vitals Group     BP 09/27/18 1115 124/73     Pulse Rate 09/27/18 1115 86     Resp 09/27/18 1115 16     Temp 09/27/18 1130 97.6 F (36.4 C)     Temp Source 09/27/18 1130 Oral     SpO2 09/27/18 1118 97 %     Weight 09/27/18 1116 123 lb (55.8 kg)     Height --      Head Circumference --      Peak Flow --      Pain Score 09/27/18 1116 7     Pain Loc --      Pain Edu? --      Excl. in [place]? --  Constitutional: Alert and oriented. Well appearing and in no acute distress.  Peers in pain when she attempts to move. Eyes: Conjunctivae are normal. Head: Atraumatic. Nose: No congestion/rhinnorhea. Mouth/Throat: Mucous membranes are moist. Neck: No stridor.  Cardiovascular: Normal rate, regular rhythm. Grossly normal heart sounds.  Good peripheral circulation. Respiratory: Normal respiratory effort.  No retractions. Lungs CTAB. Gastrointestinal: Soft and nontender. No distention. Musculoskeletal: No lower extremity tenderness nor edema.  Patient has focal tenderness across the right groin and inguinal region.  Good range of motion of the hip without any rotation or shortening involving the right.  Strong pulses lower extremities bilateral. Neurologic:  Normal speech and language. No gross focal neurologic deficits are appreciated.  Skin:  Skin is warm, dry and intact. No rash noted. Psychiatric: Mood and affect are normal. Speech and behavior are normal.  ____________________________________________   LABS (all labs ordered are listed, but only abnormal results are displayed)  Labs Reviewed  CBC - Abnormal; Notable for the following components:      Result Value   RBC 3.67 (*)    All other components within normal limits  BASIC METABOLIC PANEL - Abnormal; Notable for the following components:   Potassium 3.4 (*)    Creatinine, Ser 1.08 (*)    GFR calc non Af Amer  49 (*)    GFR calc Af Amer 56 (*)    All other components within normal limits  URINALYSIS, COMPLETE (UACMP) WITH MICROSCOPIC - Abnormal; Notable for the following components:   Color, Urine YELLOW (*)    APPearance CLEAR (*)    Leukocytes, UA MODERATE (*)    All other components within normal limits   ____________________________________________  EKG  Reviewed by me at 1150 Heart rate 80 QRS 99 QTc 440 Normal sinus rhythm, somewhat diffuse nonspecific T wave abnormalities noted, slightly more prominent V2 and V3.  No ST elevation ____________________________________________  RADIOLOGY  Ct Head Wo Contrast  Result Date: 09/27/2018 CLINICAL DATA:  Fall, head trauma EXAM: CT HEAD WITHOUT CONTRAST TECHNIQUE: Contiguous axial images were obtained from the base of the skull through the vertex without intravenous contrast. COMPARISON:  None. FINDINGS: Brain: Mild age related volume loss. Mild chronic small vessel disease throughout the deep white matter. No acute intracranial abnormality. Specifically, no hemorrhage, hydrocephalus, mass lesion, acute infarction, or significant intracranial injury. Vascular: No hyperdense vessel or unexpected calcification. Skull: No acute calvarial abnormality. Sinuses/Orbits: Visualized paranasal sinuses and mastoids clear. Orbital soft tissues unremarkable. Other: None IMPRESSION: No acute intracranial abnormality. Atrophy, chronic microvascular disease. Electronically Signed   By: Rolm Baptise M.D.   On: 09/27/2018 11:56   Dg Hip Unilat W Or Wo Pelvis 2-3 Views Right  Result Date: 09/27/2018 CLINICAL DATA:  Fall.  Right hip pain EXAM: DG HIP (WITH OR WITHOUT PELVIS) 2-3V RIGHT COMPARISON:  None. FINDINGS: Fracture noted through the right superior pubic ramus near the pubic bone. No proximal femoral abnormality. No subluxation or dislocation. 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 IMPRESSION: Medial right superior pubic ramus fracture.  Electronically Signed   By: Rolm Baptise M.D.   On: 09/27/2018 11:53     CT and x-ray reviewed, most notable for right pubic ramus fracture ____________________________________________   PROCEDURES  Procedure(s) performed: None  Procedures  Critical Care performed: No  ____________________________________________   INITIAL IMPRESSION / ASSESSMENT AND PLAN / ED COURSE  Pertinent labs & imaging results that were available during my care of the patient were reviewed by me and considered in  my medical decision making (see chart for details).   Fall.  Right pelvic pain.  No associated preceding medical symptoms, neurologic symptoms or chest pain.  Does have evidence of small hematoma over the right posterior scalp after previous fall will check CT to assure no subdural or other acute intracranial injury from that event, though denies hitting head today.  Nexus negative, not in pain while resting.  Notable pain with attempts to move the right leg.  Discussed with Dr. Francis Dowse, a advises weightbearing as tolerated, pain control and if patient requires admission he be happy to see patient in consultation.     ____________________________________________   FINAL CLINICAL IMPRESSION(S) / ED DIAGNOSES  Final diagnoses:  Closed fracture of ramus of right pubis, initial encounter [facility])        Note:  This document was prepared using Dragon voice recognition software and may include unintentional dictation errors       Delman Kitten, MD 09/27/18 1414

## 2018-09-27 NOTE — Progress Notes (Signed)
Patient c/o having urge to urinate, but not able to. Orders for foley and ivf by Dr Tressia Miners placed.

## 2018-09-27 NOTE — ED Notes (Signed)
Patient transported to CT 

## 2018-09-27 NOTE — NC FL2 (Signed)
[place] LEVEL OF CARE SCREENING TOOL     IDENTIFICATION  Patient Name: Megan Baker Birthdate: 09/20/1941 Sex: female Admission Date (Current Location): 09/27/2018  [place] and [place] Number:  Engineering geologist and Address:  [facility], [address] [place], [place] [address]      Provider Number: [id number]  Attending Physician Name and Address:  Gladstone Lighter, MD  Relative Name and Phone Number:  Cherylann Parr (Daughter) [phone] or Legrand Rams ([place]) [phone]    Current Level of Care: Hospital Recommended Level of Care: [place] Prior Approval Number:    Date Approved/Denied: 09/27/18 PASRR Number: [id number]  Discharge Plan: SNF    Current Diagnoses: Patient Active Problem List   Diagnosis Date Noted  . Pelvic fracture ([place]) 09/27/2018  . Hyperlipidemia 03/10/2018  . Hypertension 03/10/2018  . Chronic mesenteric ischemia ([place]) 03/10/2018    Orientation RESPIRATION BLADDER Height & Weight     Self, Time, Situation, Place  Normal Continent Weight: 123 lb (55.8 kg) Height:     BEHAVIORAL SYMPTOMS/MOOD NEUROLOGICAL BOWEL NUTRITION STATUS      Continent Diet(Heart healthy)  AMBULATORY STATUS COMMUNICATION OF NEEDS Skin   Extensive Assist Verbally Bruising                       Personal Care Assistance Level of Assistance  Bathing, Feeding, Dressing Bathing Assistance: Maximum assistance Feeding assistance: Independent Dressing Assistance: Maximum assistance     Functional Limitations Info  Sight, Hearing, Speech Sight Info: Adequate Hearing Info: Adequate Speech Info: Adequate    SPECIAL CARE FACTORS FREQUENCY  PT (By licensed PT)     PT Frequency: Up to 5X per week              Contractures Contractures Info: Not present    Additional Factors Info  Code Status, Allergies Code Status Info: Full Allergies Info:  Fluorescein, Latex, Neo-synephrine  Phenylephrine Hcl, Phenylephrine, Betadine Povidone Iodine           Current Medications (09/27/2018):  This is the current hospital active medication list Current Facility-Administered Medications  Medication Dose Route Frequency Provider Last Rate Last Dose  . acetaminophen (TYLENOL) tablet 650 mg  650 mg Oral Q6H PRN Gladstone Lighter, MD       Or  . acetaminophen (TYLENOL) suppository 650 mg  650 mg Rectal Q6H PRN Gladstone Lighter, MD      . albuterol (PROVENTIL) (2.5 MG/3ML) 0.083% nebulizer solution 3 mL  3 mL Inhalation Q6H PRN Gladstone Lighter, MD      . amLODipine (NORVASC) tablet 5 mg  5 mg Oral Daily Gladstone Lighter, MD      . aspirin EC tablet 81 mg  81 mg Oral Daily Gladstone Lighter, MD   81 mg at 09/27/18 1511  . azaTHIOprine (IMURAN) tablet 50 mg  50 mg Oral Daily Gladstone Lighter, MD      . budesonide (PULMICORT) nebulizer solution 0.25 mg  2 mL Nebulization BID Gladstone Lighter, MD      . buPROPion [facility] SR) 12 hr tablet 150 mg  150 mg Oral BID Gladstone Lighter, MD      . calcium-vitamin D 500-200 MG-UNIT per tablet 1 tablet  1 tablet Oral Daily Gladstone Lighter, MD      . citalopram (CELEXA) tablet 20 mg  20 mg Oral Daily Gladstone Lighter, MD      . clopidogrel (PLAVIX) tablet 75 mg  75 mg Oral Daily Carpenter, Radhika,  MD   75 mg at 09/27/18 1511  . enoxaparin (LOVENOX) injection 40 mg  40 mg Subcutaneous Q24H Gladstone Lighter, MD   40 mg at 09/27/18 1511  . [START ON 09/28/2018] Influenza vac split quadrivalent PF (FLUZONE HIGH-DOSE) injection 0.5 mL  0.5 mL Intramuscular Tomorrow-1000 Gladstone Lighter, MD      . irbesartan (AVAPRO) tablet 150 mg  150 mg Oral Daily Gladstone Lighter, MD      . metoprolol succinate (TOPROL-XL) 24 hr tablet 100 mg  100 mg Oral Daily Gladstone Lighter, MD      . morphine 2 MG/ML injection 2 mg  2 mg Intravenous Q4H PRN Gladstone Lighter, MD      . multivitamin-iron-minerals-folic acid (CENTRUM) chewable tablet  1 tablet  1 tablet Oral Daily Gladstone Lighter, MD      . ondansetron (ZOFRAN) tablet 4 mg  4 mg Oral Q6H PRN Gladstone Lighter, MD       Or  . ondansetron (ZOFRAN) injection 4 mg  4 mg Intravenous Q6H PRN Gladstone Lighter, MD      . oxyCODONE (Oxy IR/ROXICODONE) immediate release tablet 5 mg  5 mg Oral Q4H PRN Gladstone Lighter, MD      . pantoprazole (PROTONIX) EC tablet 40 mg  40 mg Oral Daily Gladstone Lighter, MD   40 mg at 09/27/18 1511  . [START ON 09/28/2018] predniSONE (DELTASONE) tablet 5 mg  5 mg Oral Q breakfast Gladstone Lighter, MD      . simvastatin (ZOCOR) tablet 20 mg  20 mg Oral Daily Gladstone Lighter, MD   Stopped at 09/27/18 1800  . traZODone (DESYREL) tablet 50 mg  50 mg Oral QHS Gladstone Lighter, MD      . triamterene-hydrochlorothiazide (MAXZIDE-25) 37.5-25 MG per tablet 1 tablet  1 tablet Oral Daily Gladstone Lighter, MD         Discharge Medications: Please see discharge summary for a list of discharge medications.  Relevant Imaging Results:  Relevant Lab Results:   Additional Information [phone]  Zettie Pho, LCSW

## 2018-09-27 NOTE — H&P (Signed)
[name]    MR#:  [id number]  DATE OF BIRTH:  [birth year]  DATE OF ADMISSION:  09/27/2018  PRIMARY CARE PHYSICIAN: Tracie Harrier, MD   REQUESTING/REFERRING PHYSICIAN:  Dr. Delman Kitten  CHIEF COMPLAINT:   Chief Complaint  Patient presents with  . Fall    HISTORY OF PRESENT ILLNESS:  Megan Baker  is a 77 y.o. female with a known history of hypertension, connective tissue disease, COPD not on home oxygen, CAD status post stents, depression, avascular necrosis of right hip, psoriasis presents to hospital after a fall.  Patient ambulates at home with a cane.  She has had a fall last week.  Today she said she was walking in the dark and might have slipped with her socks on on the hardwood floors.  She had significant pain on getting up and because of the pain she felt weakness in her right leg and fell 2 more times today.  X-rays show medial right superior pubic ramus fracture.  Significant pain and she is unable to get up from the bed. Labs are otherwise unremarkable.  CT of the head is pending.  Urine analysis negative for any infection.  Patient denies any fevers, chills, nausea, vomiting or diarrhea.  PAST MEDICAL HISTORY:   Past Medical History:  Diagnosis Date  . Allergic rhinitis   . Arthritis   . Autoimmune hepatitis ([place])   . Avascular necrosis of bone of hip, right ([place])   . Burning mouth syndrome   . Celiac sprue   . Connective tissue disease ([place])   . COPD (chronic obstructive pulmonary disease) ([place])   . Coronary artery disease   . Depression   . Gall stones   . Hyperlipidemia   . Hypertension   . Pelvic prolapse   . Psoriasis     PAST SURGICAL HISTORY:   Past Surgical History:  Procedure Laterality Date  . AMPUTATION FINGER    . CORONARY ANGIOPLASTY WITH STENT PLACEMENT    . ESOPHAGOGASTRODUODENOSCOPY (EGD) WITH PROPOFOL N/A 10/08/2016   Procedure: ESOPHAGOGASTRODUODENOSCOPY  (EGD) WITH PROPOFOL;  Surgeon: Lollie Sails, MD;  Location: [facility] ENDOSCOPY;  Service: Endoscopy;  Laterality: N/A;  . EYE SURGERY    . LIVER BIOPSY    . MUSCLE BIOPSY    . TUBAL LIGATION    . VISCERAL ANGIOGRAPHY N/A 04/16/2018   Procedure: VISCERAL ANGIOGRAPHY;  Surgeon: Algernon Huxley, MD;  Location: [place] CV LAB;  Service: Cardiovascular;  Laterality: N/A;    SOCIAL HISTORY:   Social History   Tobacco Use  . Smoking status: Former Research scientist (life sciences)  . Smokeless tobacco: Never Used  Substance Use Topics  . Alcohol use: No    FAMILY HISTORY:   Family History  Problem Relation Age of Onset  . Breast cancer Neg Hx     DRUG ALLERGIES:   Allergies  Allergen Reactions  . Fluorescein Other (See Comments)    intolerance  . Neo-Synephrine [Phenylephrine Hcl]   . Phenylephrine     REVIEW OF SYSTEMS:   Review of Systems  Constitutional: Positive for malaise/fatigue. Negative for chills, fever and weight loss.  HENT: Negative for ear discharge, ear pain, hearing loss, nosebleeds and tinnitus.   Eyes: Negative for blurred vision, double vision and photophobia.  Respiratory: Negative for cough, hemoptysis, shortness of breath and wheezing.   Cardiovascular: Negative for chest pain, palpitations, orthopnea and leg swelling.  Gastrointestinal: Negative for abdominal pain, constipation, diarrhea,  heartburn, melena, nausea and vomiting.  Genitourinary: Negative for dysuria, frequency, hematuria and urgency.  Musculoskeletal: Positive for joint pain and myalgias. Negative for back pain and neck pain.  Skin: Negative for rash.  Neurological: Negative for dizziness, tingling, tremors, sensory change, speech change, focal weakness and headaches.  Endo/Heme/Allergies: Does not bruise/bleed easily.  Psychiatric/Behavioral: Negative for depression.    MEDICATIONS AT HOME:   Prior to Admission medications   Medication Sig Start Date End Date Taking? Authorizing Provider  albuterol  (PROVENTIL HFA;VENTOLIN HFA) 108 (90 Base) MCG/ACT inhaler Inhale into the lungs every 6 (six) hours as needed for wheezing or shortness of breath.    [provider]  alendronate (FOSAMAX) 70 MG tablet Take 70 mg by mouth once a week. Take with a full glass of water on an empty stomach.    [provider]  amLODipine (NORVASC) 5 MG tablet Take by mouth. 09/02/12   [provider]  aspirin EC 81 MG tablet Take 1 tablet (81 mg total) by mouth daily. 04/16/18   Algernon Huxley, MD  azaTHIOprine (IMURAN) 50 MG tablet Take 50 mg by mouth daily.    [provider]  buPROPion (WELLBUTRIN SR) 150 MG 12 hr tablet Take 150 mg by mouth 2 (two) times daily.    [provider]  Calcium Carb-Cholecalciferol (CALCIUM 500+D) 500-400 MG-UNIT TABS Take 1 tablet by mouth daily.    [provider]  citalopram (CELEXA) 20 MG tablet Take 1 tablet by mouth  daily 04/27/15   [provider]  clopidogrel (PLAVIX) 75 MG tablet Take 1 tablet (75 mg total) by mouth daily. 04/16/18   Algernon Huxley, MD  fluticasone (FLOVENT HFA) 110 MCG/ACT inhaler Inhale 1 puff into the lungs 2 (two) times daily.    [provider]  metoprolol succinate (TOPROL-XL) 100 MG 24 hr tablet Take 100 mg by mouth daily. Take with or immediately following a meal.    [provider]  multivitamin-iron-minerals-folic acid (CENTRUM) chewable tablet Chew 1 tablet by mouth daily.    [provider]  nystatin cream (MYCOSTATIN)  07/21/15   [provider]  olmesartan (BENICAR) 40 MG tablet Take 40 mg by mouth daily.    [provider]  omeprazole (PRILOSEC) 20 MG capsule Take 20 mg by mouth 2 (two) times daily before a meal.    [provider]  predniSONE (DELTASONE) 10 MG tablet Take 5 mg by mouth daily with breakfast.    [provider]  simvastatin (ZOCOR) 20 MG tablet Take 20 mg by mouth daily.    [provider]  traZODone  (DESYREL) 50 MG tablet Take 50 mg by mouth at bedtime.    [provider]  triamterene-hydrochlorothiazide (MAXZIDE-25) 37.5-25 MG tablet Take 1 tablet by mouth daily.    [provider]      VITAL SIGNS:  Blood pressure 113/62, pulse 81, temperature 97.6 F (36.4 C), temperature source Oral, resp. rate 17, weight 55.8 kg, SpO2 98 %.  PHYSICAL EXAMINATION:   Physical Exam  GENERAL:  77 y.o.-year-old thin built elderly patient lying in the bed with no acute distress.  EYES: Pupils equal, round, reactive to light and accommodation. No scleral icterus. Extraocular muscles intact.  HEENT: Head atraumatic, normocephalic. Oropharynx and nasopharynx clear.  NECK:  Supple, no jugular venous distention. No thyroid enlargement, no tenderness.  LUNGS: Normal breath sounds bilaterally, no wheezing, rales,rhonchi or crepitation. No use of accessory muscles of respiration. Decreased bibasilar breath sounds CARDIOVASCULAR:  S1, S2 normal. No  rubs, or gallops. 2/6 systolic murmur present ABDOMEN: Soft, nontender, nondistended. Bowel sounds present. No organomegaly or mass.  EXTREMITIES: No pedal edema, cyanosis, or clubbing.  NEUROLOGIC: Cranial nerves II through XII are intact. Muscle strength normal- limited range of motion of both legs due to pelvic pain. Sensation intact. Gait not checked.  PSYCHIATRIC: The patient is alert and oriented x 3.  SKIN: No obvious rash, lesion, or ulcer.   LABORATORY PANEL:   CBC Recent Labs  Lab 09/27/18 1213  WBC 8.3  HGB 12.4  HCT 36.7  PLT 419   ------------------------------------------------------------------------------------------------------------------  Chemistries  Recent Labs  Lab 09/27/18 1213  NA 137  K 3.4*  CL 99  CO2 29  GLUCOSE 91  BUN 9  CREATININE 1.08*  CALCIUM 9.6   ------------------------------------------------------------------------------------------------------------------  Cardiac Enzymes No results  for input(s): TROPONINI in the last 168 hours. ------------------------------------------------------------------------------------------------------------------  RADIOLOGY:  Ct Head Wo Contrast  Result Date: 09/27/2018 CLINICAL DATA:  Fall, head trauma EXAM: CT HEAD WITHOUT CONTRAST TECHNIQUE: Contiguous axial images were obtained from the base of the skull through the vertex without intravenous contrast. COMPARISON:  None. FINDINGS: Brain: Mild age related volume loss. Mild chronic small vessel disease throughout the deep white matter. No acute intracranial abnormality. Specifically, no hemorrhage, hydrocephalus, mass lesion, acute infarction, or significant intracranial injury. Vascular: No hyperdense vessel or unexpected calcification. Skull: No acute calvarial abnormality. Sinuses/Orbits: Visualized paranasal sinuses and mastoids clear. Orbital soft tissues unremarkable. Other: None IMPRESSION: No acute intracranial abnormality. Atrophy, chronic microvascular disease. Electronically Signed   By: Rolm Baptise M.D.   On: 09/27/2018 11:56   Dg Hip Unilat W Or Wo Pelvis 2-3 Views Right  Result Date: 09/27/2018 CLINICAL DATA:  Fall.  Right hip pain EXAM: DG HIP (WITH OR WITHOUT PELVIS) 2-3V RIGHT COMPARISON:  None. FINDINGS: Fracture noted through the right superior pubic ramus near the pubic bone. No proximal femoral abnormality. No subluxation or dislocation. 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 IMPRESSION: Medial right superior pubic ramus fracture. Electronically Signed   By: Rolm Baptise M.D.   On: 09/27/2018 11:53    EKG:   Orders placed or performed during the hospital encounter of 09/27/18  . ED EKG  . ED EKG  . EKG 12-Lead  . EKG 12-Lead    IMPRESSION AND PLAN:   Leyani Gargus  is a 77 y.o. female with a known history of hypertension, connective tissue disease, COPD not on home oxygen, CAD status post stents, depression, avascular necrosis of right hip,  psoriasis presents to hospital after a fall.   1. Fall and superior pubic ramus fracture-admit under office for pain control -Physical therapy consult -No infection identified  2.  Hypokalemia-being replaced  3.  Hypertension-continue home medications.  Patient on Norvasc, ARB, Toprol and maxzide  4.  Depression and anxiety-continue home medications, patient on Celexa, Wellbutrin.  5.  CAD-status post stents.  Stable.  Patient on aspirin, Plavix, statin and blood pressure medications which will be continued  6.  DVT prophylaxis-Lovenox  Physical therapy consulted Patient ambulates with a cane at baseline      All the records are reviewed and case discussed with ED provider. Management plans discussed with the patient, family and they are in agreement.  CODE STATUS: Full Code  TOTAL TIME TAKING CARE OF THIS PATIENT: 50 minutes.    Gladstone Lighter M.D on 09/27/2018 at 1:56 PM  Between 7am to 6pm - Pager - [phone]  After 6pm  go to [email or web address] - password [email or web address]  Office  [phone]  CC: Primary care physician; Tracie Harrier, MD

## 2018-09-27 NOTE — ED Notes (Signed)
Pts daughter informed ok for pt to eat. Attempted to ambulate patient, pt unable to tolerate getting to sitting position on side of bed.  Dr Jacqualine Code notified.

## 2018-09-27 NOTE — Clinical Social Work Note (Signed)
Clinical Social Work Assessment  Patient Details  Name: Megan Baker MRN: [id number] Date of Birth: 04-07-1941  Date of referral:  09/27/18               Reason for consult:  Facility Placement                Permission sought to share information with:  Chartered certified accountant granted to share information::  Yes, Verbal Permission Granted  Name::        Agency::  [facility] SNFs  Relationship::     Contact Information:     Housing/Transportation Living arrangements for the past 2 months:  [place] of Information:  Patient, Medical Team, Adult Children Patient Interpreter Needed:  None Criminal Activity/Legal Involvement Pertinent to Current Situation/Hospitalization:  No - Comment as needed Significant Relationships:  Adult Children, Church, Warehouse manager, Other Family Members Lives with:  Adult Children Do you feel safe going back to the place where you live?  Yes Need for family participation in patient care:  No (Coment)  Care giving concerns:  Patient has a pelvic fracture   Social Worker assessment / plan:  The CSW met with the patient and her daughter at bedside to discuss discharge planning. The CSW introduced self and role in care. The CSW further explained that the type of injury the patient sustained in her fall (pelvic rami fracture) often needs short term rehab for healing due to lack of surgical intervention available. The CSW explained that PT would evaluate the patient and make a recommendation based on her mobility (HHPT vs. SNF). The patient and her daughter provided verbal agreement to a SNF referral should PT recommend such. The CSW explained that the patient's [facility] Medicare) would need to provide prior authorization.  The CSW has begun the referral process and will follow up with bed offers as available. The patient will need EMS to transport. DC possible tomorrow.  Employment status:  Retired Designer, industrial/product PT Recommendations:  Not assessed at this time Information / Referral to community resources:  Sisters  Patient/Family's Response to care:  The patient and her family thanked the CSW.  Patient/Family's Understanding of and Emotional Response to Diagnosis, Current Treatment, and Prognosis:  The patient and her daughter seem to understand the referral process and PT evaluation. They are in agreement with possible SNF if that is the recommendation.  Emotional Assessment Appearance:  Appears stated age Attitude/Demeanor/Rapport:  Gracious, Engaged Affect (typically observed):  Pleasant, Calm, Accepting, Appropriate Orientation:  Oriented to Self, Oriented to Place, Oriented to  Time, Oriented to Situation Alcohol / Substance use:  Never Used Psych involvement (Current and /or in the community):  No (Comment)  Discharge Needs  Concerns to be addressed:  Care Coordination, Discharge Planning Concerns Readmission within the last 30 days:  No Current discharge risk:  Physical Impairment Barriers to Discharge:  Continued Medical Work up   [employer], LCSW 09/27/2018, 5:02 PM

## 2018-09-28 ENCOUNTER — Other Ambulatory Visit: Payer: Self-pay

## 2018-09-28 LAB — BASIC METABOLIC PANEL
Anion gap: 4 — ABNORMAL LOW (ref 5–15)
BUN: 10 mg/dL (ref 8–23)
CALCIUM: 8.5 mg/dL — AB (ref 8.9–10.3)
CO2: 27 mmol/L (ref 22–32)
CREATININE: 0.93 mg/dL (ref 0.44–1.00)
Chloride: 108 mmol/L (ref 98–111)
GFR calc Af Amer: >60 mL/min (ref >60)
GFR, EST NON AFRICAN AMERICAN: 58 mL/min — AB (ref >60)
GLUCOSE: 99 mg/dL (ref 70–99)
Potassium: 3.9 mmol/L (ref 3.5–5.1)
Sodium: 139 mmol/L (ref 135–145)

## 2018-09-28 LAB — CBC
HCT: 31.4 % — ABNORMAL LOW (ref 35.0–47.0)
Hemoglobin: 10.7 g/dL — ABNORMAL LOW (ref 12.0–16.0)
MCH: 34 pg (ref 26.0–34.0)
MCHC: 34.1 g/dL (ref 32.0–36.0)
MCV: 99.7 fL (ref 80.0–100.0)
PLATELETS: 315 10*3/uL (ref 150–440)
RBC: 3.15 MIL/uL — ABNORMAL LOW (ref 3.80–5.20)
RDW: 13.4 % (ref 11.5–14.5)
WBC: 4.1 10*3/uL (ref 3.6–11.0)

## 2018-09-28 MED ORDER — KETOROLAC TROMETHAMINE 30 MG/ML IJ SOLN: 15.0000 mg | Freq: Three times a day (TID) | INTRAMUSCULAR | Status: DC

## 2018-09-28 MED ORDER — KETOROLAC TROMETHAMINE 30 MG/ML IJ SOLN
15.0000 mg | Freq: Three times a day (TID) | INTRAMUSCULAR | Status: AC
  Administered 2018-09-28 – 2018-09-29 (×4): 15 mg via INTRAVENOUS
  Filled 2018-09-28 (×4): qty 1

## 2018-09-28 MED ORDER — BUPROPION HCL ER (XL) 150 MG PO TB24
300.0000 mg | ORAL_TABLET | Freq: Every day | ORAL | Status: DC
  Administered 2018-09-29: 300 mg via ORAL
  Filled 2018-09-28: qty 2

## 2018-09-28 NOTE — Progress Notes (Signed)
Clinical Social Worker (CSW) met with patient and presented bed offers. She chose [facility]. CSW made patient aware that PT is still pending and she may be able to D/C home. CSW also made patient aware that [employer] will have to approve SNF. Patient verbalized her understanding. Per [facility] liaison she will start [employer] SNF authorization today.    , LCSW [phone]  

## 2018-09-28 NOTE — Progress Notes (Signed)
PT is recommending SNF. Clinical Education officer, museum (CSW) sent PT note to [facility] to pursue [facility] SNF authorization.   [employer], LCSW [phone]

## 2018-09-28 NOTE — Progress Notes (Signed)
Advanced care plan.  Purpose of the Encounter: CODE STATUS  Parties in Attendance: patient her self  Patient's Decision Capacity:intact  Subjective/Patient's story:  Pt is 77 y/o with h/o htn, hyperlipidemia, cad, copd, presenting with fall and noted to have hip fx  Objective/Medical story  I discussed with patient her desires for  Cardiac and pulmonary resuscitation , pt states that she would not to be intubated or have cardiac resuscitation done  Goals of care determination:   dnr  CODE STATUS: dnr   Time spent discussing advanced care planning: 16 minutes

## 2018-09-28 NOTE — Consult Note (Signed)
ORTHOPAEDIC CONSULTATION  REQUESTING PHYSICIAN: Dustin Flock, MD  Chief Complaint: right hip/pelvis pain s/p fall  HPI: Megan Baker is a 77 y.o. female who complains of left sided pelvis pain s/p fall at home.  Xrays show a minimally displaced and comminuted fracture of the right superior rami extending into the area of the pubis.  Patient has mild pain with movement and no pain at rest and no numbness and tingling.  Her family is at the bedside.  Past Medical History:  Diagnosis Date  . Allergic rhinitis   . Arthritis   . Autoimmune hepatitis ([place])   . Avascular necrosis of bone of hip, right ([place])   . Burning mouth syndrome   . Celiac sprue   . Connective tissue disease ([place])   . COPD (chronic obstructive pulmonary disease) ([place])   . Coronary artery disease   . Depression   . Gall stones   . Hyperlipidemia   . Hypertension   . Pelvic prolapse   . Psoriasis    Past Surgical History:  Procedure Laterality Date  . AMPUTATION FINGER    . CORONARY ANGIOPLASTY WITH STENT PLACEMENT    . ESOPHAGOGASTRODUODENOSCOPY (EGD) WITH PROPOFOL N/A 10/08/2016   Procedure: ESOPHAGOGASTRODUODENOSCOPY (EGD) WITH PROPOFOL;  Surgeon: Lollie Sails, MD;  Location[facility] ENDOSCOPY;  Service: Endoscopy;  Laterality: N/A;  . EYE SURGERY    . LIVER BIOPSY    . MUSCLE BIOPSY    . TUBAL LIGATION    . VISCERAL ANGIOGRAPHY N/A 04/16/2018   Procedure: VISCERAL ANGIOGRAPHY;  Surgeon: Algernon Huxley, MD;  Location: [place] CV LAB;  Service: Cardiovascular;  Laterality: N/A;   Social History   Socioeconomic History  . Marital status: Widowed    Spouse name: Not on file  . Number of children: Not on file  . Years of education: Not on file  . Highest education level: Not on file  Occupational History  . Not on file  Social Needs  . Financial resource strain: Not on file  . Food insecurity:    Worry: Not on file    Inability: Not on file  . Transportation needs:    Medical: Not on  file    Non-medical: Not on file  Tobacco Use  . Smoking status: Former Research scientist (life sciences)  . Smokeless tobacco: Never Used  Substance and Sexual Activity  . Alcohol use: No  . Drug use: No  . Sexual activity: Not on file  Lifestyle  . Physical activity:    Days per week: Not on file    Minutes per session: Not on file  . Stress: Not on file  Relationships  . Social connections:    Talks on phone: Not on file    Gets together: Not on file    Attends religious service: Not on file    Active member of club or organization: Not on file    Attends meetings of clubs or organizations: Not on file    Relationship status: Not on file  Other Topics Concern  . Not on file  Social History Narrative   Lives at home by herself.   Family History  Problem Relation Age of Onset  . Dementia Mother   . Dementia Sister   . Breast cancer Neg Hx    Allergies  Allergen Reactions  . Fluorescein Other (See Comments)    intolerance  . Latex   . Neo-Synephrine [Phenylephrine Hcl]   . Phenylephrine   . Betadine [Povidone Iodine] Rash   Prior  to Admission medications   Medication Sig Start Date End Date Taking? Authorizing Provider  albuterol (PROVENTIL HFA;VENTOLIN HFA) 108 (90 Base) MCG/ACT inhaler Inhale into the lungs every 6 (six) hours as needed for wheezing or shortness of breath.   Yes [provider]  alendronate (FOSAMAX) 70 MG tablet Take 70 mg by mouth once a week. Take with a full glass of water on an empty stomach.   Yes [provider]  amLODipine (NORVASC) 5 MG tablet Take 5 mg by mouth daily.  09/02/12  Yes [provider]  aspirin EC 81 MG tablet Take 1 tablet (81 mg total) by mouth daily. 04/16/18  Yes Dew, Erskine Squibb, MD  azaTHIOprine (IMURAN) 50 MG tablet Take 50 mg by mouth daily.   Yes [provider]  buPROPion (WELLBUTRIN XL) 300 MG 24 hr tablet Take 300 mg by mouth daily.   Yes [provider]  calcium carbonate (OSCAL) 1500 (600 Ca) MG TABS  tablet Take 1,500 mg by mouth 2 (two) times daily with a meal.   Yes [provider]  clopidogrel (PLAVIX) 75 MG tablet Take 1 tablet (75 mg total) by mouth daily. 04/16/18  Yes Dew, Erskine Squibb, MD  fluticasone (FLOVENT HFA) 110 MCG/ACT inhaler Inhale 1 puff into the lungs 2 (two) times daily.   Yes [provider]  metoprolol succinate (TOPROL-XL) 100 MG 24 hr tablet Take 100 mg by mouth daily. Take with or immediately following a meal.   Yes [provider]  multivitamin-iron-minerals-folic acid (CENTRUM) chewable tablet Chew 1 tablet by mouth daily.   Yes [provider]  nystatin cream (MYCOSTATIN)  07/21/15  Yes [provider]  olmesartan (BENICAR) 40 MG tablet Take 40 mg by mouth daily.   Yes [provider]  omeprazole (PRILOSEC) 20 MG capsule Take 20 mg by mouth 2 (two) times daily before a meal.   Yes [provider]  predniSONE (DELTASONE) 10 MG tablet Take 5 mg by mouth daily with breakfast.   Yes [provider]  simvastatin (ZOCOR) 20 MG tablet Take 20 mg by mouth daily.   Yes [provider]  traZODone (DESYREL) 50 MG tablet Take 50 mg by mouth at bedtime.   Yes [provider]  triamterene-hydrochlorothiazide (MAXZIDE-25) 37.5-25 MG tablet Take 1 tablet by mouth daily.   Yes [provider]  vitamin B-12 (CYANOCOBALAMIN) 1000 MCG tablet Take 1,000 mcg by mouth daily.   Yes [provider]  buPROPion (WELLBUTRIN SR) 150 MG 12 hr tablet Take 150 mg by mouth 2 (two) times daily.    [provider]  Calcium Carb-Cholecalciferol (CALCIUM 500+D) 500-400 MG-UNIT TABS Take 1 tablet by mouth daily.    [provider]  citalopram (CELEXA) 20 MG tablet Take 1 tablet by mouth  daily 04/27/15   [provider]   Ct Head Wo Contrast  Result Date: 09/27/2018 CLINICAL DATA:  Fall, head trauma EXAM: CT HEAD WITHOUT CONTRAST TECHNIQUE: Contiguous axial images were obtained  from the base of the skull through the vertex without intravenous contrast. COMPARISON:  None. FINDINGS: Brain: Mild age related volume loss. Mild chronic small vessel disease throughout the deep white matter. No acute intracranial abnormality. Specifically, no hemorrhage, hydrocephalus, mass lesion, acute infarction, or significant intracranial injury. Vascular: No hyperdense vessel or unexpected calcification. Skull: No acute calvarial abnormality. Sinuses/Orbits: Visualized paranasal sinuses and mastoids clear. Orbital soft tissues unremarkable. Other: None IMPRESSION: No acute intracranial abnormality. Atrophy, chronic microvascular disease. Electronically Signed  By: Rolm Baptise M.D.   On: 09/27/2018 11:56   Dg Hip Unilat W Or Wo Pelvis 2-3 Views Right  Result Date: 09/27/2018 CLINICAL DATA:  Fall.  Right hip pain EXAM: DG HIP (WITH OR WITHOUT PELVIS) 2-3V RIGHT COMPARISON:  None. FINDINGS: Fracture noted through the right superior pubic ramus near the pubic bone. No proximal femoral abnormality. No subluxation or dislocation. 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 IMPRESSION: Medial right superior pubic ramus fracture. Electronically Signed   By: Rolm Baptise M.D.   On: 09/27/2018 11:53    Positive ROS: All other systems have been reviewed and were otherwise negative with the exception of those mentioned in the HPI and as above.  Physical Exam: General: Alert, no acute distress  MUSCULOSKELETAL: Right lower extremity: Skin is intact.  There is no erythema or ecchymosis.  Patient is neurovascular intact.  She has no pain with logrolling or internal and external rotation of her right hip.  Distally she is neurovascular intact with intact motor function and no detectable weakness.  Assessment: Minimally displaced fracture of the right superior rami  Plan: Patient has a minimally displaced fracture of the right superior rami which will not require surgical intervention.   I recommend physical therapy evaluation.  Continue current pain management.  She may be weightbearing as tolerated on the right lower extremity.  She may require skilled nursing facility upon discharge.  Patient may follow-up in our office in 4 weeks for reevaluation and x-ray.    [place], MD    09/28/2018 12:54 PM

## 2018-09-28 NOTE — Progress Notes (Signed)
[place] at [facility]                                                                                                                                                                                  Patient Demographics   Megan Baker, is a 77 y.o. female, DOB - 1941-06-29, XNT:[id number]  Admit date - 09/27/2018   Admitting Physician Gladstone Lighter, MD  Outpatient Primary MD for the patient is Tracie Harrier, MD   LOS - 0  Subjective:  Patient admitted with pelvic fracture continues to have significant pain denies any chest pain or shortness of   Review of Systems:   CONSTITUTIONAL: No documented fever. No fatigue, weakness. No weight gain, no weight loss.  EYES: No blurry or double vision.  ENT: No tinnitus. No postnasal drip. No redness of the oropharynx.  RESPIRATORY: No cough, no wheeze, no hemoptysis. No dyspnea.  CARDIOVASCULAR: No chest pain. No orthopnea. No palpitations. No syncope.  GASTROINTESTINAL: No nausea, no vomiting or diarrhea. No abdominal pain. No melena or hematochezia.  GENITOURINARY: No dysuria or hematuria.  ENDOCRINE: No polyuria or nocturia. No heat or cold intolerance.  HEMATOLOGY: No anemia. No bruising. No bleeding.  INTEGUMENTARY: No rashes. No lesions.  MUSCULOSKELETAL: No arthritis. No swelling. No gout.  Positive pain in her pelvic region NEUROLOGIC: No numbness, tingling, or ataxia. No seizure-type activity.  PSYCHIATRIC: No anxiety. No insomnia. No ADD.    Vitals:   Vitals:   09/27/18 1610 09/27/18 2225 09/27/18 2310 09/28/18 0856  BP: 110/62 118/69 (!) 105/55 116/73  Pulse: 74 75 75 74  Resp:   16 16  Temp: 97.9 F (36.6 C)  (!) 96.9 F (36.1 C) 98.1 F (36.7 C)  TempSrc: Oral  Axillary   SpO2: 100%  98% 100%  Weight:        Wt Readings from Last 3 Encounters:  09/27/18 55.8 kg  05/15/18 60.2 kg  04/16/18 61.2 kg     Intake/Output Summary (Last 24 hours) at 09/28/2018 1236 Last data filed  at 09/28/2018 1011 Gross per 24 hour  Intake 1145.62 ml  Output 600 ml  Net 545.62 ml    Physical Exam:   GENERAL: Pleasant-appearing in no apparent distress.  HEAD, EYES, EARS, NOSE AND THROAT: Atraumatic, normocephalic. Extraocular muscles are intact. Pupils equal and reactive to light. Sclerae anicteric. No conjunctival injection. No oro-pharyngeal erythema.  NECK: Supple. There is no jugular venous distention. No bruits, no lymphadenopathy, no thyromegaly.  HEART: Regular rate and rhythm,. No murmurs, no rubs, no clicks.  LUNGS: Clear to auscultation bilaterally. No rales or rhonchi. No wheezes.  ABDOMEN: Soft, flat, nontender, nondistended. Has good bowel  sounds. No hepatosplenomegaly appreciated.  EXTREMITIES: No evidence of any cyanosis, clubbing, or peripheral edema.  +2 pedal and radial pulses bilaterally.  NEUROLOGIC: The patient is alert, awake, and oriented x3 with no focal motor or sensory deficits appreciated bilaterally.  SKIN: Moist and warm with no rashes appreciated.  Psych: Not anxious, depressed LN: No inguinal LN enlargement    Antibiotics   Anti-infectives (From admission, onward)   None      Medications   Scheduled Meds: . aspirin EC  81 mg Oral Daily  . azaTHIOprine  50 mg Oral Daily  . budesonide  2 mL Nebulization BID  . buPROPion  150 mg Oral BID  . calcium-vitamin D  1 tablet Oral Daily  . citalopram  20 mg Oral Daily  . clopidogrel  75 mg Oral Daily  . enoxaparin (LOVENOX) injection  40 mg Subcutaneous Q24H  . Influenza vac split quadrivalent PF  0.5 mL Intramuscular Tomorrow-1000  . ketorolac  15 mg Intravenous Q8H  . metoprolol succinate  100 mg Oral Daily  . multivitamin with minerals  1 tablet Oral Daily  . pantoprazole  40 mg Oral Daily  . predniSONE  5 mg Oral Q breakfast  . simvastatin  20 mg Oral Daily  . traZODone  50 mg Oral QHS   Continuous Infusions: . sodium chloride 60 mL/hr at 09/28/18 1153   PRN Meds:.acetaminophen **OR**  acetaminophen, albuterol, morphine injection, ondansetron **OR** ondansetron (ZOFRAN) IV, oxyCODONE   Data Review:   Micro Results No results found for this or any previous visit (from the past 240 hour(s)).  Radiology Reports Ct Head Wo Contrast  Result Date: 09/27/2018 CLINICAL DATA:  Fall, head trauma EXAM: CT HEAD WITHOUT CONTRAST TECHNIQUE: Contiguous axial images were obtained from the base of the skull through the vertex without intravenous contrast. COMPARISON:  None. FINDINGS: Brain: Mild age related volume loss. Mild chronic small vessel disease throughout the deep white matter. No acute intracranial abnormality. Specifically, no hemorrhage, hydrocephalus, mass lesion, acute infarction, or significant intracranial injury. Vascular: No hyperdense vessel or unexpected calcification. Skull: No acute calvarial abnormality. Sinuses/Orbits: Visualized paranasal sinuses and mastoids clear. Orbital soft tissues unremarkable. Other: None IMPRESSION: No acute intracranial abnormality. Atrophy, chronic microvascular disease. Electronically Signed   By: Rolm Baptise M.D.   On: 09/27/2018 11:56   Ct Chest Wo Contrast  Result Date: 09/17/2018 CLINICAL DATA:  Followup of pulmonary nodule. Productive cough for more than 6 months. Ex-smoker. EXAM: CT CHEST WITHOUT CONTRAST TECHNIQUE: Multidetector CT imaging of the chest was performed following the standard protocol without IV contrast. COMPARISON:  08/28/2017 FINDINGS: Cardiovascular: Aortic and branch vessel atherosclerosis. Tortuous thoracic aorta. Mild cardiomegaly, without pericardial effusion. Multivessel coronary artery atherosclerosis. Mediastinum/Nodes: No mediastinal or definite hilar adenopathy, given limitations of unenhanced CT. Tiny hiatal hernia. Fluid level in the esophagus, including on image 72/2. Lungs/Pleura: No pleural fluid. Mild centrilobular emphysema. Redemonstration of bibasilar volume loss and cylindrical bronchiectasis.  Ill-defined left upper lobe pulmonary nodule again identified. This measures 5 mm on image 38/3, similar back to 08/01/2016. Upper Abdomen: Normal imaged portions of the liver, spleen, pancreas, adrenal glands. Musculoskeletal: No acute osseous abnormality. IMPRESSION: 1. 5 mm left upper lobe pulmonary nodule, stable for greater than 2 years. This can be presumed benign. 2. Bibasilar volume loss and cylindrical bronchiectasis are similar. This is likely post infectious or inflammatory. 3. Tiny hiatal hernia. Esophageal air fluid level suggests dysmotility or gastroesophageal reflux. 4. Coronary artery atherosclerosis. Aortic Atherosclerosis (ICD10-I70.0). Electronically Signed  By: Abigail Miyamoto M.D.   On: 09/17/2018 13:36   Dg Hip Unilat W Or Wo Pelvis 2-3 Views Right  Result Date: 09/27/2018 CLINICAL DATA:  Fall.  Right hip pain EXAM: DG HIP (WITH OR WITHOUT PELVIS) 2-3V RIGHT COMPARISON:  None. FINDINGS: Fracture noted through the right superior pubic ramus near the pubic bone. No proximal femoral abnormality. No subluxation or dislocation. 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 IMPRESSION: Medial right superior pubic ramus fracture. Electronically Signed   By: Rolm Baptise M.D.   On: 09/27/2018 11:53     CBC Recent Labs  Lab 09/27/18 1213 09/28/18 0322  WBC 8.3 4.1  HGB 12.4 10.7*  HCT 36.7 31.4*  PLT 419 315  MCV 99.8 99.7  MCH 33.9 34.0  MCHC 33.9 34.1  RDW 13.0 13.4    Chemistries  Recent Labs  Lab 09/27/18 1213 09/28/18 0322  NA 137 139  K 3.4* 3.9  CL 99 108  CO2 29 27  GLUCOSE 91 99  BUN 9 10  CREATININE 1.08* 0.93  CALCIUM 9.6 8.5*   ------------------------------------------------------------------------------------------------------------------ estimated creatinine clearance is 40.7 mL/min (by C-G formula based on SCr of 0.93  mg/dL). ------------------------------------------------------------------------------------------------------------------ No results for input(s): HGBA1C in the last 72 hours. ------------------------------------------------------------------------------------------------------------------ No results for input(s): CHOL, HDL, LDLCALC, TRIG, CHOLHDL, LDLDIRECT in the last 72 hours. ------------------------------------------------------------------------------------------------------------------ No results for input(s): TSH, T4TOTAL, T3FREE, THYROIDAB in the last 72 hours.  Invalid input(s): FREET3 ------------------------------------------------------------------------------------------------------------------ No results for input(s): VITAMINB12, FOLATE, FERRITIN, TIBC, IRON, RETICCTPCT in the last 72 hours.  Coagulation profile No results for input(s): INR, PROTIME in the last 168 hours.  No results for input(s): DDIMER in the last 72 hours.  Cardiac Enzymes No results for input(s): CKMB, TROPONINI, MYOGLOBIN in the last 168 hours.  Invalid input(s): CK ------------------------------------------------------------------------------------------------------------------ Invalid input(s): [place]   Perle Brickhouse  is a 77 y.o. female with a known history of hypertension, connective tissue disease, COPD not on home oxygen, CAD status post stents, depression, avascular necrosis of right hip, psoriasis presents to hospital after a fall.   1. Fall and superior pubic ramus fracture-admit under office for pain control -Physical therapy consult pending   2.  Hypokalemia-replaced  3.  Hypertension-borderline continue Toprol discontinue all other blood pressure medications  4.  Depression and anxiety-continue home medications, patient on Celexa, Wellbutrin.  5.  CAD-status post stents.  Stable.  Patient on aspirin, Plavix, statin and Lopressor   6.  DVT  prophylaxis-Lovenox      Code Status Orders  (From admission, onward)         Start     Ordered   09/28/18 1016  Do not attempt resuscitation (DNR)  Continuous    Question Answer Comment  In the event of cardiac or respiratory ARREST Do not call a "code blue"   In the event of cardiac or respiratory ARREST Do not perform Intubation, CPR, defibrillation or ACLS   In the event of cardiac or respiratory ARREST Use medication by any route, position, wound care, and other measures to relive pain and suffering. May use oxygen, suction and manual treatment of airway obstruction as needed for comfort.      09/28/18 1015        Code Status History    Date Active Date Inactive Code Status Order ID Comments User Context   09/27/2018 1438 09/28/2018 1015 Full Code [id number]  Gladstone Lighter, MD Inpatient  Consults none  DVT Prophylaxis  Lovenox   Lab Results  Component Value Date   PLT 315 09/28/2018     Time Spent in minutes   97mn Greater than 50% of time spent in care coordination and counseling patient regarding the condition and plan of care.   SDustin FlockM.D on 09/28/2018 at 12:36 PM  Between 7am to 6pm - Pager - [phone]  After 6pm go to [email or web address] - pProofreader [employer]   Office  [phone]

## 2018-09-28 NOTE — Care Management Note (Signed)
Case Management Note  Patient Details  Name: Megan Baker MRN: [id number] Date of Birth: February 23, 1941  Subjective/Objective:              Patient under observation care after a fall that resulted in a superior pubic ramus fracture.  Patient lives at home alone, she has 4 daughters that live close by.  PCP verified as Dr. Ginette Pitman, does not remember the last time she went to see him.  She states she does have a mammogram scheduled for this upcoming Thursday and thinks she will go and see him sometime after that.  Her pharmacy is [facility], no barriers to obtaining prescriptions.  Patient chart reports multiple falls at home, patient reports that this last time was because she slipped in her socks on the floor, asked if she had bedroom shoes and socks with anti slip surfaces- she reports she does but she was not wearing them.  She has a cane if needed but no walker in the home and no shower chair.  Patient states that she has a hard time getting in the bath tub and she slips in the shower so she has been taking sink baths.  She reports she can dress herself and use the toilet by herself, she also states she cooks.  She does not drive any more but her daughter Uvaldo Bristle provides transportation to appointments and shopping.  Her daughter is off work on Tuesdays and Thursdays so she tries to make all of her appointments on those days.  PT to evaluate.  I will continue to follow and assess needs for discharge.          Action/Plan:   Expected Discharge Date:                  Expected Discharge Plan:     In-House Referral:     Discharge planning Services  CM Consult  Post Acute Care Choice:    Choice offered to:     DME Arranged:    DME Agency:     HH Arranged:    HH Agency:     Status of Service:  In process, will continue to follow  If discussed at Long Length of Stay Meetings, dates discussed:    Additional Comments:  Shelbie Hutching, RN 09/28/2018, 11:20 AM

## 2018-09-28 NOTE — Care Management Obs Status (Signed)
[place] NOTIFICATION   Patient Details  Name: Megan Baker MRN: [id number] Date of Birth: 17-Sep-1941   Medicare Observation Status Notification Given:  Yes    Shelbie Hutching, RN 09/28/2018, 11:19 AM

## 2018-09-28 NOTE — Plan of Care (Signed)

## 2018-09-28 NOTE — Evaluation (Signed)
Physical Therapy Evaluation Patient Details Name: Megan Baker MRN: [id number] DOB: 11/10/1941 Today's Date: 09/28/2018   History of Present Illness  Pt is a 77 y/o F who presented after multiple falls and found to have medial R superior pubic ramus fx.  Plan is for conservative management, no surgery. CT head neg.  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    Clinical Impression  Pt admitted with above diagnosis. Pt currently with functional limitations due to the deficits listed below (see PT Problem List). Megan Baker is independent at baseline ambulating without AD but with 3 recent falls.  Pt's mobility significantly limited by pain despite premedication.  Pt currently requires mod assist for bed mobility and reports 10/10 pelvic pain in sitting, resulting in inability to sit EOB without posterior lean and LOB.  Pt reports dizziness sitting EOB which does not improve and thus pt assisted back to supine.  BP taken in supine reading 133/67.  Given pt's current mobility status, recommending SNF at d/c.   Pt will benefit from skilled PT to increase their independence and safety with mobility to allow discharge to the venue listed below.      Follow Up Recommendations SNF    Equipment Recommendations  Other (comment)(TBD at next venue of care)    Recommendations for Other Services       Precautions / Restrictions Precautions Precautions: Fall Restrictions Weight Bearing Restrictions: Yes RLE Weight Bearing: Weight bearing as tolerated LLE Weight Bearing: Weight bearing as tolerated      Mobility  Bed Mobility Overal bed mobility: Needs Assistance Bed Mobility: Supine to Sit;Sit to Supine     Supine to sit: Mod assist;HOB elevated Sit to supine: Mod assist   General bed mobility comments: Pt requires cues for sequencing and increased time with assist to elevate trunk and advance RLE to EOB.  Bed pad used to assist pt in scooting to EOB. Pt reports feeling  dizzy sitting EOB which does not improve after sitting for ~3 minutes.  Assist provided to bring LEs back into bed for sit>supine and to scoot toward HOB.   Transfers                 General transfer comment: Not safe to attempt at this time  Ambulation/Gait                Stairs            Wheelchair Mobility    Modified Rankin (Stroke Patients Only)       Balance Overall balance assessment: Needs assistance;History of Falls Sitting-balance support: Bilateral upper extremity supported;Feet supported Sitting balance-Leahy Scale: Poor Sitting balance - Comments: Pt relies on BUE support with feet supported sitting EOB and min guard for up to 5 seconds, otherwise requires min>mod assist to prevent posterior lean Postural control: Posterior lean                                   Pertinent Vitals/Pain Pain Assessment: 0-10 Pain Score: 10-Worst pain ever Pain Location: pelvic region Pain Descriptors / Indicators: Grimacing;Guarding;Sharp Pain Intervention(s): Limited activity within patient's tolerance;Monitored during session;Utilized relaxation techniques;Premedicated before session    Home Living Family/patient expects to be discharged to:: Skilled nursing facility Living Arrangements: Alone Available Help at Discharge: Family;Available PRN/intermittently Type of Home: House Home Access: Stairs to enter Entrance Stairs-Rails: None Entrance Stairs-Number of Steps: 1 Home Layout: One  level Home Equipment: Cane - single point;Bedside commode      Prior Function Level of Independence: Needs assistance   Gait / Transfers Assistance Needed: Ambulates majority of the time without AD but does use SPC at times if she is usnteady.  Has had 3 recent falls.   ADL's / Homemaking Assistance Needed: Pt taking sponge bathing ind, cooking, cleaning.  Family does the driving and grocery shopping.         Hand Dominance        Extremity/Trunk  Assessment   Upper Extremity Assessment Upper Extremity Assessment: Overall WFL for tasks assessed    Lower Extremity Assessment Lower Extremity Assessment: RLE deficits/detail;LLE deficits/detail RLE Deficits / Details: Limited due to pain and muscle guarding.  Pt able to provide enough effort through the pain for a 2/5 grossly at the hip LLE Deficits / Details: Limited due to pain and muscle guarding.  Pt able to provide enough effort through the pain for a 3/5 grossly at the hip       Communication   Communication: No difficulties  Cognition Arousal/Alertness: Awake/alert Behavior During Therapy: WFL for tasks assessed/performed Overall Cognitive Status: (Unsure of pt's baseline)                                 General Comments: Pt answers questions appropriately but with delay and oftentimes talking through her answers until she can arrive at the correct answer      General Comments General comments (skin integrity, edema, etc.): BP taken once pt back to supine: 133/67    Exercises General Exercises - Lower Extremity Ankle Circles/Pumps: AROM;Both;10 reps;Supine Hip ABduction/ADduction: AAROM;Right;10 reps;Supine Straight Leg Raises: AAROM;Left;10 reps;Other (comment);Supine(unable to tolerate RLE)   Assessment/Plan    PT Assessment Patient needs continued PT services  PT Problem List Decreased strength;Decreased range of motion;Decreased activity tolerance;Decreased balance;Decreased mobility;Decreased cognition;Decreased knowledge of use of DME;Decreased safety awareness;Pain       PT Treatment Interventions DME instruction;Gait training;Stair training;Functional mobility training;Therapeutic exercise;Therapeutic activities;Balance training;Neuromuscular re-education;Patient/family education;Modalities    PT Goals (Current goals can be found in the Care Plan section)  Acute Rehab PT Goals Patient Stated Goal: decreased pain and return to PLOF PT Goal  Formulation: With patient Time For Goal Achievement: 10/12/18 Potential to Achieve Goals: Good    Frequency 7X/week   Barriers to discharge Decreased caregiver support;Inaccessible home environment 1 step to enter home and pt lives alone    Co-evaluation               AM-PAC PT "6 Clicks" Daily Activity  Outcome Measure Difficulty turning over in bed (including adjusting bedclothes, sheets and blankets)?: Unable Difficulty moving from lying on back to sitting on the side of the bed? : Unable Difficulty sitting down on and standing up from a chair with arms (e.g., wheelchair, bedside commode, etc,.)?: Unable Help needed moving to and from a bed to chair (including a wheelchair)?: Total Help needed walking in hospital room?: Total Help needed climbing 3-5 steps with a railing? : Total 6 Click Score: 6    End of Session   Activity Tolerance: Patient limited by pain;Other (comment)(limited by pt reports of dizziness) Patient left: in bed;with call bell/phone within reach;with bed alarm set;with family/visitor present Nurse Communication: Mobility status;Other (comment)(SCDs not operating, +dizziness, BP reading) PT Visit Diagnosis: Pain;Unsteadiness on feet (R26.81);Repeated falls (R29.6);Muscle weakness (generalized) (M62.81);Difficulty in walking, not elsewhere classified (R26.2) Pain - Right/Left:  Right Pain - part of body: (pelvic region)    Time: 3578-9784 PT Time Calculation (min) (ACUTE ONLY): 28 min   Charges:   PT Evaluation $PT Eval Moderate Complexity: 1 Mod PT Treatments $Therapeutic Activity: 8-22 mins        Collie Siad PT, DPT 09/28/2018, 3:27 PM

## 2018-09-29 MED ORDER — OXYCODONE HCL 5 MG PO TABS: 5.0000 mg | ORAL_TABLET | ORAL | 0 refills | Status: DC | PRN

## 2018-09-29 MED ORDER — ACETAMINOPHEN 325 MG PO TABS: 650.0000 mg | ORAL_TABLET | Freq: Four times a day (QID) | ORAL | Status: AC | PRN

## 2018-09-29 NOTE — Progress Notes (Signed)
Initial Nutrition Assessment  DOCUMENTATION CODES:   Severe malnutrition in context of chronic illness  INTERVENTION:  Recommend liberalizing diet to regular.  Recommend Ensure Enlive po BID, each supplement provides 350 kcal and 20 grams of protein. Patient prefers vanilla flavor.  Continue daily MVI.  NUTRITION DIAGNOSIS:   Severe Malnutrition related to chronic illness(COPD) as evidenced by severe fat depletion, moderate muscle depletion, severe muscle depletion.  GOAL:   Patient will meet greater than or equal to 90% of their needs  MONITOR:   PO intake, Supplement acceptance, Labs, Weight trends, I & O's  REASON FOR ASSESSMENT:   Malnutrition Screening Tool    ASSESSMENT:   77 year old female with PMHx of autoimmune hepatitis, CAD, celiac sprue, COPD, depression, HLD, HTN, arthritis who is admitted after a fall for pain control found to have suprapubic rami fracture.   Met with patient at bedside. She reports she has had a decreased appetite for a while now (almost 10 years). Endorses anorexia (absence of hunger) and early satiety. She eats 2 meals per day. She may have vegetables or soup at her meals. She has trouble swallowing tough meat or bread.   UBW was 175 lbs. Patient reports a 50 lbs over an unknown time frame.  Medications reviewed and include: Oscal with D 1 tablet daily, MVI daily.  Labs reviewed: Anion gap 4.  NUTRITION - FOCUSED PHYSICAL EXAM:    Most Recent Value  Orbital Region  Severe depletion  Upper Arm Region  Severe depletion  Thoracic and Lumbar Region  Moderate depletion  Buccal Region  Severe depletion  Temple Region  Severe depletion  Clavicle Bone Region  Severe depletion  Clavicle and Acromion Bone Region  Severe depletion  Scapular Bone Region  Severe depletion  Dorsal Hand  Severe depletion  Patellar Region  Moderate depletion  Anterior Thigh Region  Moderate depletion  Posterior Calf Region  Moderate depletion  Edema (RD  Assessment)  None  Hair  Reviewed  Eyes  Reviewed  Mouth  Reviewed  Skin  Reviewed  Nails  Reviewed     Diet Order:   Diet Order            Diet - low sodium heart healthy        Diet Heart Room service appropriate? Yes; Fluid consistency: Thin  Diet effective now              EDUCATION NEEDS:   Education needs have been addressed  Skin:  Skin Assessment: Reviewed RN Assessment  Last BM:  PTA (09/23/2018 per chart)  Height:   Ht Readings from Last 1 Encounters:  09/28/18 5' 5"  (1.651 m)    Weight:   Wt Readings from Last 1 Encounters:  09/28/18 55.8 kg    Ideal Body Weight:  56.8 kg  BMI:  Body mass index is 20.47 kg/m.  Estimated Nutritional Needs:   Kcal:  2458-0998 (25-30 kcal/kg)  Protein:  75-85 grams (1.3-1.5 grams/kg)  Fluid:  1.4 L/day (25 mL/kg)  Willey Blade, MS, RD, LDN Office: [phone] Pager: [phone] After Hours/Weekend Pager: [phone]

## 2018-09-29 NOTE — Discharge Summary (Signed)
[place] at [facility], 77 y.o., DOB 1941/03/13, MRN [id number]. Admission date: 09/27/2018 Discharge Date 09/29/2018 Primary MD Tracie Harrier, MD Admitting Physician Gladstone Lighter, MD  Admission Diagnosis  Closed fracture of ramus of right pubis, initial encounter [facility]) [S32.591A]  Discharge Diagnosis   Active Problems: Fall with suprapubic rami fracture Hypokalemia Hypertension Depression anxiety Coronary artery disease COPD   Hospital Course [name] 77 y.o.femalewith a known history of hypertension, connective tissue disease, COPD not on home oxygen, CAD status post stents, depression, avascular necrosis of right hip,psoriasis presents to hospital after a fall.  Patient had severe pain and was evaluated and was noted to have a suprapubic rami fracture.  Patient was placed under observation for pain control.  She is still having significant discomfort.  She will need to go to rehab.           Consults  None  Significant Tests:  See full reports for all details     Ct Head Wo Contrast  Result Date: 09/27/2018 CLINICAL DATA:  Fall, head trauma EXAM: CT HEAD WITHOUT CONTRAST TECHNIQUE: Contiguous axial images were obtained from the base of the skull through the vertex without intravenous contrast. COMPARISON:  None. FINDINGS: Brain: Mild age related volume loss. Mild chronic small vessel disease throughout the deep white matter. No acute intracranial abnormality. Specifically, no hemorrhage, hydrocephalus, mass lesion, acute infarction, or significant intracranial injury. Vascular: No hyperdense vessel or unexpected calcification. Skull: No acute calvarial abnormality. Sinuses/Orbits: Visualized paranasal sinuses and mastoids clear. Orbital soft tissues unremarkable. Other: None IMPRESSION: No acute intracranial abnormality. Atrophy, chronic microvascular disease. Electronically Signed   By: Rolm Baptise M.D.   On:  09/27/2018 11:56   Ct Chest Wo Contrast  Result Date: 09/17/2018 CLINICAL DATA:  Followup of pulmonary nodule. Productive cough for more than 6 months. Ex-smoker. EXAM: CT CHEST WITHOUT CONTRAST TECHNIQUE: Multidetector CT imaging of the chest was performed following the standard protocol without IV contrast. COMPARISON:  08/28/2017 FINDINGS: Cardiovascular: Aortic and branch vessel atherosclerosis. Tortuous thoracic aorta. Mild cardiomegaly, without pericardial effusion. Multivessel coronary artery atherosclerosis. Mediastinum/Nodes: No mediastinal or definite hilar adenopathy, given limitations of unenhanced CT. Tiny hiatal hernia. Fluid level in the esophagus, including on image 72/2. Lungs/Pleura: No pleural fluid. Mild centrilobular emphysema. Redemonstration of bibasilar volume loss and cylindrical bronchiectasis. Ill-defined left upper lobe pulmonary nodule again identified. This measures 5 mm on image 38/3, similar back to 08/01/2016. Upper Abdomen: Normal imaged portions of the liver, spleen, pancreas, adrenal glands. Musculoskeletal: No acute osseous abnormality. IMPRESSION: 1. 5 mm left upper lobe pulmonary nodule, stable for greater than 2 years. This can be presumed benign. 2. Bibasilar volume loss and cylindrical bronchiectasis are similar. This is likely post infectious or inflammatory. 3. Tiny hiatal hernia. Esophageal air fluid level suggests dysmotility or gastroesophageal reflux. 4. Coronary artery atherosclerosis. Aortic Atherosclerosis (ICD10-I70.0). Electronically Signed   By: Abigail Miyamoto M.D.   On: 09/17/2018 13:36   Dg Hip Unilat W Or Wo Pelvis 2-3 Views Right  Result Date: 09/27/2018 CLINICAL DATA:  Fall.  Right hip pain EXAM: DG HIP (WITH OR WITHOUT PELVIS) 2-3V RIGHT COMPARISON:  None. FINDINGS: Fracture noted through the right superior pubic ramus near the pubic bone. No proximal femoral abnormality. No subluxation or dislocation. Calcification in the pelvis compatible with  calcified fibroids. IMPRESSION: Medial right superior pubic ramus fracture. Electronically Signed   By: Rolm Baptise M.D.   On: 09/27/2018 11:53  Today   Subjective:   Megan Baker patient doing much better pain improved still having difficulty with ambulation  Objective:   Blood pressure (!) 141/76, pulse 75, temperature 98.8 F (37.1 C), temperature source Oral, resp. rate 18, height 5' 5"  (1.651 m), weight 55.8 kg, SpO2 98 %.  .  Intake/Output Summary (Last 24 hours) at 09/29/2018 1358 Last data filed at 09/29/2018 0900 Gross per 24 hour  Intake 360 ml  Output 300 ml  Net 60 ml    Exam VITAL SIGNS: Blood pressure (!) 141/76, pulse 75, temperature 98.8 F (37.1 C), temperature source Oral, resp. rate 18, height 5' 5"  (1.651 m), weight 55.8 kg, SpO2 98 %.  GENERAL:  77 y.o.-year-old patient lying in the bed with no acute distress.  EYES: Pupils equal, round, reactive to light and accommodation. No scleral icterus. Extraocular muscles intact.  HEENT: Head atraumatic, normocephalic. Oropharynx and nasopharynx clear.  NECK:  Supple, no jugular venous distention. No thyroid enlargement, no tenderness.  LUNGS: Normal breath sounds bilaterally, no wheezing, rales,rhonchi or crepitation. No use of accessory muscles of respiration.  CARDIOVASCULAR: S1, S2 normal. No murmurs, rubs, or gallops.  ABDOMEN: Soft, nontender, nondistended. Bowel sounds present. No organomegaly or mass.  EXTREMITIES: No pedal edema, cyanosis, or clubbing.  NEUROLOGIC: Cranial nerves II through XII are intact. Muscle strength 5/5 in all extremities. Sensation intact. Gait not checked.  PSYCHIATRIC: The patient is alert and oriented x 3.  SKIN: No obvious rash, lesion, or ulcer.   Data Review     CBC w Diff:  Lab Results  Component Value Date   WBC 4.1 09/28/2018   HGB 10.7 (L) 09/28/2018   HGB 15.9 07/30/2012   HCT 31.4 (L) 09/28/2018   HCT 46.2 07/30/2012   PLT 315 09/28/2018   PLT 435  07/30/2012   CMP:  Lab Results  Component Value Date   NA 139 09/28/2018   K 3.9 09/28/2018   CL 108 09/28/2018   CO2 27 09/28/2018   BUN 10 09/28/2018   CREATININE 0.93 09/28/2018  .  Micro Results No results found for this or any previous visit (from the past 240 hour(s)).      Code Status Orders  (From admission, onward)         Start     Ordered   09/28/18 1016  Do not attempt resuscitation (DNR)  Continuous    Question Answer Comment  In the event of cardiac or respiratory ARREST Do not call a "code blue"   In the event of cardiac or respiratory ARREST Do not perform Intubation, CPR, defibrillation or ACLS   In the event of cardiac or respiratory ARREST Use medication by any route, position, wound care, and other measures to relive pain and suffering. May use oxygen, suction and manual treatment of airway obstruction as needed for comfort.      09/28/18 1015        Code Status History    Date Active Date Inactive Code Status Order ID Comments User Context   09/27/2018 1438 09/28/2018 1015 Full Code [id number]  Gladstone Lighter, MD Inpatient          Contact information for after-discharge care    Destination    [facility] Preferred SNF .   Service:  Skilled Nursing Contact information: [address] [place] [phone]              Discharge Medications   Allergies as of 09/29/2018  Reactions   Fluorescein Other (See Comments)   intolerance   Latex    Neo-synephrine [phenylephrine Hcl]    Phenylephrine    Betadine [povidone Iodine] Rash      Medication List    STOP taking these medications   amLODipine 5 MG tablet Commonly known as:  NORVASC   olmesartan 40 MG tablet Commonly known as:  BENICAR   predniSONE 10 MG tablet Commonly known as:  DELTASONE   triamterene-hydrochlorothiazide 37.5-25 MG tablet Commonly known as:  MAXZIDE-25     TAKE these medications   acetaminophen 325  MG tablet Commonly known as:  TYLENOL Take 2 tablets (650 mg total) by mouth every 6 (six) hours as needed for mild pain (or Fever >/= 101).   albuterol 108 (90 Base) MCG/ACT inhaler Commonly known as:  PROVENTIL HFA;VENTOLIN HFA Inhale into the lungs every 6 (six) hours as needed for wheezing or shortness of breath.   alendronate 70 MG tablet Commonly known as:  FOSAMAX Take 70 mg by mouth once a week. Take with a full glass of water on an empty stomach.   aspirin EC 81 MG tablet Take 1 tablet (81 mg total) by mouth daily.   azaTHIOprine 50 MG tablet Commonly known as:  IMURAN Take 50 mg by mouth daily.   buPROPion 150 MG 12 hr tablet Commonly known as:  WELLBUTRIN SR Take 150 mg by mouth 2 (two) times daily.   buPROPion 300 MG 24 hr tablet Commonly known as:  WELLBUTRIN XL Take 300 mg by mouth daily.   CALCIUM 500+D 500-400 MG-UNIT Tabs Generic drug:  Calcium Carb-Cholecalciferol Take 1 tablet by mouth daily.   calcium carbonate 1500 (600 Ca) MG Tabs tablet Commonly known as:  OSCAL Take 1,500 mg by mouth 2 (two) times daily with a meal.   citalopram 20 MG tablet Commonly known as:  CELEXA Take 1 tablet by mouth  daily   clopidogrel 75 MG tablet Commonly known as:  PLAVIX Take 1 tablet (75 mg total) by mouth daily.   fluticasone 110 MCG/ACT inhaler Commonly known as:  FLOVENT HFA Inhale 1 puff into the lungs 2 (two) times daily.   metoprolol succinate 100 MG 24 hr tablet Commonly known as:  TOPROL-XL Take 100 mg by mouth daily. Take with or immediately following a meal.   multivitamin-iron-minerals-folic acid chewable tablet Chew 1 tablet by mouth daily.   nystatin cream Commonly known as:  MYCOSTATIN   omeprazole 20 MG capsule Commonly known as:  PRILOSEC Take 20 mg by mouth 2 (two) times daily before a meal.   oxyCODONE 5 MG immediate release tablet Commonly known as:  Oxy IR/ROXICODONE Take 1 tablet (5 mg total) by mouth every 4 (four) hours as  needed for moderate pain.   simvastatin 20 MG tablet Commonly known as:  ZOCOR Take 20 mg by mouth daily.   traZODone 50 MG tablet Commonly known as:  DESYREL Take 50 mg by mouth at bedtime.   vitamin B-12 1000 MCG tablet Commonly known as:  CYANOCOBALAMIN Take 1,000 mcg by mouth daily.          Total Time in preparing paper work, data evaluation and todays exam - 52 minutes  Dustin Flock M.D on 09/29/2018 at [place]  [phone]

## 2018-09-29 NOTE — Plan of Care (Signed)
  Problem: Education: Goal: Knowledge of General Education information will improve Description Including pain rating scale, medication(s)/side effects and non-pharmacologic comfort measures Outcome: Progressing   

## 2018-09-29 NOTE — Progress Notes (Signed)
Pt more confused during the night. She got out of bed 3 times. She can be combative at times.

## 2018-09-29 NOTE — Progress Notes (Signed)
Patient is medically stable for D/C to [facility] today. Per Otila Kluver [facility] liaison [facility] authorization has been received and patient can come today to room 805. RN will call report and arrange EMS for transport. Clinical Education officer, museum (CSW) sent D/C orders to [facility] via [facility]. Patient is aware of above. Patient's daughter Levada Dy is at bedside and aware of above. Please reconsult if future social work needs arise. CSW signing off.   [employer], LCSW [phone]

## 2018-09-29 NOTE — Clinical Social Work Placement (Signed)
   CLINICAL SOCIAL WORK PLACEMENT  NOTE  Date:  09/29/2018  Patient Details  Name: Megan Baker MRN: [id number] Date of Birth: Mar 14, 1941  Clinical Social Work is seeking post-discharge placement for this patient at the [place] level of care (*CSW will initial, date and re-position this form in  chart as items are completed):  Yes   Patient/family provided with [place] Work Department's list of facilities offering this level of care within the geographic area requested by the patient (or if unable, by the patient's family).  Yes   Patient/family informed of their freedom to choose among providers that offer the needed level of care, that participate in Medicare, Medicaid or managed care program needed by the patient, have an available bed and are willing to accept the patient.  Yes   Patient/family informed of [facility]'s ownership interest in [facility] and [facility], as well as of the fact that they are under no obligation to receive care at these facilities.  PASRR submitted to EDS on 09/27/18     PASRR number received on 09/27/18     Existing PASRR number confirmed on       FL2 transmitted to all facilities in geographic area requested by pt/family on 09/27/18     FL2 transmitted to all facilities within larger geographic area on       Patient informed that his/her managed care company has contracts with or will negotiate with certain facilities, including the following:        Yes   Patient/family informed of bed offers received.  Patient chooses bed at ([facility] )     Physician recommends and patient chooses bed at      Patient to be transferred to ([facility] ) on 09/29/18.  Patient to be transferred to facility by [facility] )     Patient family notified on 09/29/18 of transfer.  Name of family member notified:  (Patient's daughter Levada Dy is at bedside and aware of D/C today. )     PHYSICIAN       Additional  Comment:    _______________________________________________ Yohann Curl, Veronia Beets, LCSW 09/29/2018, 2:31 PM

## 2018-09-29 NOTE — Progress Notes (Signed)
Report called and given to [place] at [facility]. EMS called for transport. IV removed. Pt dressed awaiting transport.

## 2018-09-29 NOTE — Discharge Instructions (Signed)
[facility] Stay Proper nutrition can help your body recover from illness and injury.   Foods and beverages high in protein, vitamins, and minerals help rebuild muscle loss, promote healing, & reduce fall risk.   In addition to eating healthy foods, a nutrition shake is an easy, delicious way to get the nutrition you need during and after your hospital stay  It is recommended that you continue to drink 2 bottles per day of:       Ensure Enlive  Tips for adding a nutrition shake into your routine: As allowed, drink one with vitamins or medications instead of water or juice Enjoy one as a tasty mid-morning or afternoon snack Drink cold or make a milkshake out of it Drink one instead of milk with cereal or snacks Use as a coffee creamer   Available at the following grocery stores and pharmacies:           * [facility] [phone]            For COUPONS visit: www.ensure.com/join or [email or web address]://[email or web address]-may.[email or web address]/   Suggested Substitutions Ensure Plus = Boost Plus = Carnation Breakfast Essentials = Boost Compact Ensure Active Clear = Boost Breeze Glucerna Shake = Boost Glucose Control = Carnation Breakfast Essentials SUGAR FREE

## 2018-09-29 NOTE — Progress Notes (Signed)
Physical Therapy Treatment Patient Details Name: Megan Baker MRN: [id number] DOB: May 27, 1941 Today's Date: 09/29/2018    History of Present Illness Pt is a 77 y/o F who presented after multiple falls and found to have medial R superior pubic ramus fx.  Plan is for conservative management, no surgery. CT head neg.  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     PT Comments    Megan Baker made good progress toward mobility goals.  Pt requires mod assist for trunk support with supine>sit and min +2 assist for sit<>stand transfers.  Pt ambulated 30 ft requiring min assist for RW management and to remain steady.  Pt limited by pain and RLE weakness.  SNF remains most appropriate d/c plan at this time.     Follow Up Recommendations  SNF     Equipment Recommendations  Other (comment)(TBD at next venue of care)    Recommendations for Other Services       Precautions / Restrictions Precautions Precautions: Fall Restrictions Weight Bearing Restrictions: Yes RLE Weight Bearing: Weight bearing as tolerated LLE Weight Bearing: Weight bearing as tolerated    Mobility  Bed Mobility Overal bed mobility: Needs Assistance Bed Mobility: Supine to Sit     Supine to sit: HOB elevated;Mod assist     General bed mobility comments: Cues for sequencing and pt uses bed rail to pull to sitting.  Pt requires assist to elevate trunk.    Transfers Overall transfer level: Needs assistance Equipment used: Rolling walker (2 wheeled)(youth RW) Transfers: Sit to/from [facility] Sit to Stand: Min assist;+2 physical assistance Stand pivot transfers: Min assist;+2 safety/equipment;+2 physical assistance       General transfer comment: Pt requires assist to boost to standing.  Cues for sequencing when pivoting and assist to manage RW.  Cues for upright posture while pivoting.   Ambulation/Gait Ambulation/Gait assistance: Min assist;+2 safety/equipment Gait  Distance (Feet): 30 Feet Assistive device: Rolling walker (2 wheeled)(youth RW) Gait Pattern/deviations: Decreased step length - right;Decreased step length - left;Antalgic;Trunk flexed Gait velocity: decreased Gait velocity interpretation: <1.31 ft/sec, indicative of household ambulator General Gait Details: Pt with dec step length Bil and toward end of ambulation R knee partial buckle consistently likely due to pain and fatigue.  Cues for sequecing using RW and assist managing RW as pt pushes RW too far ahead.    Stairs             Wheelchair Mobility    Modified Rankin (Stroke Patients Only)       Balance Overall balance assessment: Needs assistance;History of Falls Sitting-balance support: Feet supported;Single extremity supported Sitting balance-Leahy Scale: Poor Sitting balance - Comments: Pt relies on at least 1UE support with feet supported sitting EOB, requiring intermittent min assist due to posterior lean.  Postural control: Posterior lean Standing balance support: Bilateral upper extremity supported;During functional activity Standing balance-Leahy Scale: Poor Standing balance comment: Pt relies on BUE support for static and dynamic activities                            Cognition Arousal/Alertness: Awake/alert Behavior During Therapy: WFL for tasks assessed/performed Overall Cognitive Status: History of cognitive impairments - at baseline(per RN, pt with h/o dementia)  Exercises      General Comments General comments (skin integrity, edema, etc.): Daughter present during session.  BP in supine: 138/72, in sitting 141/78      Pertinent Vitals/Pain Pain Assessment: Faces Faces Pain Scale: Hurts even more Pain Location: pelvic region Pain Descriptors / Indicators: Grimacing;Guarding Pain Intervention(s): Limited activity within patient's tolerance;Monitored during session;Premedicated before  session;Repositioned    Home Living                      Prior Function            PT Goals (current goals can now be found in the care plan section) Acute Rehab PT Goals Patient Stated Goal: decreased pain and return to PLOF PT Goal Formulation: With patient Time For Goal Achievement: 10/12/18 Potential to Achieve Goals: Good Progress towards PT goals: Progressing toward goals    Frequency    7X/week      PT Plan Current plan remains appropriate    Co-evaluation              AM-PAC PT "6 Clicks" Daily Activity  Outcome Measure  Difficulty turning over in bed (including adjusting bedclothes, sheets and blankets)?: Unable Difficulty moving from lying on back to sitting on the side of the bed? : Unable Difficulty sitting down on and standing up from a chair with arms (e.g., wheelchair, bedside commode, etc,.)?: Unable Help needed moving to and from a bed to chair (including a wheelchair)?: A Little Help needed walking in hospital room?: A Little Help needed climbing 3-5 steps with a railing? : A Lot 6 Click Score: 11    End of Session Equipment Utilized During Treatment: Gait belt Activity Tolerance: Patient limited by pain;Patient limited by fatigue Patient left: with call bell/phone within reach;with family/visitor present;in chair;with chair alarm set Nurse Communication: Mobility status PT Visit Diagnosis: Pain;Unsteadiness on feet (R26.81);Repeated falls (R29.6);Muscle weakness (generalized) (M62.81);Difficulty in walking, not elsewhere classified (R26.2) Pain - Right/Left: Right Pain - part of body: (pelvic region)     Time: 1010-1044 PT Time Calculation (min) (ACUTE ONLY): 34 min  Charges:  $Gait Training: 8-22 mins $Therapeutic Activity: 8-22 mins                     Megan Baker PT, DPT 09/29/2018, 2:14 PM

## 2018-09-29 NOTE — Progress Notes (Signed)
Per [facility] liaison [facility] authorization is still pending.   [employer], LCSW [phone]

## 2018-10-23 ENCOUNTER — Encounter: Payer: Self-pay | Admitting: Emergency Medicine

## 2018-10-23 ENCOUNTER — Other Ambulatory Visit: Payer: Self-pay

## 2018-10-23 ENCOUNTER — Emergency Department
Admission: EM | Admit: 2018-10-23 | Discharge: 2018-10-24 | Disposition: A | Discharge status: Other Acute Inpatient Hospital | Payer: Medicare Other | Attending: Emergency Medicine | Admitting: Emergency Medicine

## 2018-10-23 ENCOUNTER — Emergency Department: Payer: Medicare Other

## 2018-10-23 DIAGNOSIS — Z91040 Latex allergy status: Secondary | ICD-10-CM | POA: Diagnosis not present

## 2018-10-23 DIAGNOSIS — Z7982 Long term (current) use of aspirin: Secondary | ICD-10-CM | POA: Diagnosis not present

## 2018-10-23 DIAGNOSIS — J449 Chronic obstructive pulmonary disease, unspecified: Secondary | ICD-10-CM | POA: Insufficient documentation

## 2018-10-23 DIAGNOSIS — R45851 Suicidal ideations: Secondary | ICD-10-CM

## 2018-10-23 DIAGNOSIS — R443 Hallucinations, unspecified: Secondary | ICD-10-CM

## 2018-10-23 DIAGNOSIS — F329 Major depressive disorder, single episode, unspecified: Secondary | ICD-10-CM | POA: Insufficient documentation

## 2018-10-23 DIAGNOSIS — Z87891 Personal history of nicotine dependence: Secondary | ICD-10-CM | POA: Insufficient documentation

## 2018-10-23 DIAGNOSIS — I10 Essential (primary) hypertension: Secondary | ICD-10-CM | POA: Insufficient documentation

## 2018-10-23 DIAGNOSIS — R440 Auditory hallucinations: Secondary | ICD-10-CM | POA: Diagnosis not present

## 2018-10-23 DIAGNOSIS — Z79899 Other long term (current) drug therapy: Secondary | ICD-10-CM | POA: Insufficient documentation

## 2018-10-23 DIAGNOSIS — F0150 Vascular dementia without behavioral disturbance: Secondary | ICD-10-CM | POA: Insufficient documentation

## 2018-10-23 DIAGNOSIS — I259 Chronic ischemic heart disease, unspecified: Secondary | ICD-10-CM | POA: Insufficient documentation

## 2018-10-23 DIAGNOSIS — Z7902 Long term (current) use of antithrombotics/antiplatelets: Secondary | ICD-10-CM | POA: Diagnosis not present

## 2018-10-23 DIAGNOSIS — A150 Tuberculosis of lung: Secondary | ICD-10-CM

## 2018-10-23 DIAGNOSIS — Z046 Encounter for general psychiatric examination, requested by authority: Secondary | ICD-10-CM | POA: Diagnosis present

## 2018-10-23 LAB — COMPREHENSIVE METABOLIC PANEL
ALBUMIN: 4.2 g/dL (ref 3.5–5.0)
ALT: 12 U/L (ref 0–44)
AST: 22 U/L (ref 15–41)
Alkaline Phosphatase: 67 U/L (ref 38–126)
Anion gap: 8 (ref 5–15)
BUN: 12 mg/dL (ref 8–23)
CO2: 28 mmol/L (ref 22–32)
Calcium: 9.5 mg/dL (ref 8.9–10.3)
Chloride: 103 mmol/L (ref 98–111)
Creatinine, Ser: 0.86 mg/dL (ref 0.44–1.00)
GFR calc Af Amer: >60 mL/min (ref >60)
GFR calc non Af Amer: >60 mL/min (ref >60)
GLUCOSE: 83 mg/dL (ref 70–99)
POTASSIUM: 3.7 mmol/L (ref 3.5–5.1)
SODIUM: 139 mmol/L (ref 135–145)
Total Bilirubin: 0.4 mg/dL (ref 0.3–1.2)
Total Protein: 8 g/dL (ref 6.5–8.1)

## 2018-10-23 LAB — ACETAMINOPHEN LEVEL: Acetaminophen (Tylenol), Serum: <10 ug/mL — ABNORMAL LOW (ref 10–30)

## 2018-10-23 LAB — ETHANOL: Alcohol, Ethyl (B): <10 mg/dL (ref <10)

## 2018-10-23 LAB — CBC
HCT: 37.1 % (ref 36.0–46.0)
HEMOGLOBIN: 11.9 g/dL — AB (ref 12.0–15.0)
MCH: 32.9 pg (ref 26.0–34.0)
MCHC: 32.1 g/dL (ref 30.0–36.0)
MCV: 102.5 fL — ABNORMAL HIGH (ref 80.0–100.0)
Platelets: 507 10*3/uL — ABNORMAL HIGH (ref 150–400)
RBC: 3.62 MIL/uL — AB (ref 3.87–5.11)
RDW: 13.4 % (ref 11.5–15.5)
WBC: 5.3 10*3/uL (ref 4.0–10.5)
nRBC: 0 % (ref 0.0–0.2)

## 2018-10-23 LAB — SALICYLATE LEVEL: Salicylate Lvl: <7 mg/dL (ref 2.8–30.0)

## 2018-10-23 NOTE — ED Notes (Signed)
Belongings: Shirt, pants, socks, shoes, head cap, robe, walker.

## 2018-10-23 NOTE — ED Provider Notes (Signed)
[facility] Emergency Department Provider Note  ____________________________________________  Time seen: Approximately 7:45 PM  I have reviewed the triage vital signs and the nursing notes.   HISTORY  Chief Complaint Psychiatric Evaluation   HPI Megan Baker is a 77 y.o. female with a history of vascular dementia, hypertension, hyperlipidemia, CAD, autoimmune hepatitis who presents IVC from [facility] for auditory hallucinations and suicidal ideation.  According to IVC papers "patient thinks people in her attic and called the police 4 times, walked out in the rain in her pajamas, wrote a suicide note 2 weeks ago, broke her pelvic bone and went to [facility]".  Patient reports that she keeps hearing voices inside her house that keep cussing her.  The voices did not tell her to kill herself.  She denies ever hearing voices before.  She reports that this started when she came home from rehab after sustaining pelvic fractures.  Patient reports that she did write a suicide note 2 weeks ago because she was feeling depressed.  She denies feeling suicidal at this time.  Patient denies headache, fever or chills, dysuria or hematuria, chest pain or shortness of breath, abdominal pain, nausea or vomiting, diarrhea.  She reports seeing crawly creatures at nighttime as well but she thinks this is because of her poor vision.  She reports having had prior suicidal thoughts throughout her life but never acted on them.  She does have a history of depression.  She lives alone.  Past Medical History:  Diagnosis Date  . Allergic rhinitis   . Arthritis   . Autoimmune hepatitis ([place])   . Avascular necrosis of bone of hip, right ([place])   . Burning mouth syndrome   . Celiac sprue   . Connective tissue disease ([place])   . COPD (chronic obstructive pulmonary disease) ([place])   . Coronary artery disease   . Depression   . Gall stones   . Hyperlipidemia   . Hypertension   . Pelvic prolapse    . Psoriasis     Patient Active Problem List   Diagnosis Date Noted  . Pelvic fracture ([place]) 09/27/2018  . Hyperlipidemia 03/10/2018  . Hypertension 03/10/2018  . Chronic mesenteric ischemia ([place]) 03/10/2018    Past Surgical History:  Procedure Laterality Date  . AMPUTATION FINGER    . CORONARY ANGIOPLASTY WITH STENT PLACEMENT    . ESOPHAGOGASTRODUODENOSCOPY (EGD) WITH PROPOFOL N/A 10/08/2016   Procedure: ESOPHAGOGASTRODUODENOSCOPY (EGD) WITH PROPOFOL;  Surgeon: Lollie Sails, MD;  Location: [facility] ENDOSCOPY;  Service: Endoscopy;  Laterality: N/A;  . EYE SURGERY    . LIVER BIOPSY    . MUSCLE BIOPSY    . TUBAL LIGATION    . VISCERAL ANGIOGRAPHY N/A 04/16/2018   Procedure: VISCERAL ANGIOGRAPHY;  Surgeon: Algernon Huxley, MD;  Location: [place] CV LAB;  Service: Cardiovascular;  Laterality: N/A;    Prior to Admission medications   Medication Sig Start Date End Date Taking? Authorizing Provider  acetaminophen (TYLENOL) 325 MG tablet Take 2 tablets (650 mg total) by mouth every 6 (six) hours as needed for mild pain (or Fever >/= 101). 09/29/18   Dustin Flock, MD  albuterol (PROVENTIL HFA;VENTOLIN HFA) 108 (90 Base) MCG/ACT inhaler Inhale into the lungs every 6 (six) hours as needed for wheezing or shortness of breath.    [provider]  alendronate (FOSAMAX) 70 MG tablet Take 70 mg by mouth once a week. Take with a full glass of water on an empty stomach.  [provider]  aspirin EC 81 MG tablet Take 1 tablet (81 mg total) by mouth daily. 04/16/18   Algernon Huxley, MD  azaTHIOprine (IMURAN) 50 MG tablet Take 50 mg by mouth daily.    [provider]  buPROPion (WELLBUTRIN XL) 300 MG 24 hr tablet Take 300 mg by mouth daily.    [provider]  calcium carbonate (OSCAL) 1500 (600 Ca) MG TABS tablet Take 1,500 mg by mouth 2 (two) times daily with a meal.    [provider]  clopidogrel (PLAVIX) 75 MG tablet Take 1 tablet (75 mg total) by  mouth daily. 04/16/18   Algernon Huxley, MD  fluticasone (FLOVENT HFA) 110 MCG/ACT inhaler Inhale 1 puff into the lungs 2 (two) times daily.    [provider]  metoprolol succinate (TOPROL-XL) 100 MG 24 hr tablet Take 100 mg by mouth daily. Take with or immediately following a meal.    [provider]  multivitamin-iron-minerals-folic acid (CENTRUM) chewable tablet Chew 1 tablet by mouth daily.    [provider]  nystatin cream (MYCOSTATIN)  07/21/15   [provider]  omeprazole (PRILOSEC) 20 MG capsule Take 20 mg by mouth 2 (two) times daily before a meal.    [provider]  oxyCODONE (OXY IR/ROXICODONE) 5 MG immediate release tablet Take 1 tablet (5 mg total) by mouth every 4 (four) hours as needed for moderate pain. 09/29/18   Dustin Flock, MD  simvastatin (ZOCOR) 20 MG tablet Take 20 mg by mouth daily.    [provider]  traZODone (DESYREL) 50 MG tablet Take 50 mg by mouth at bedtime.    [provider]  vitamin B-12 (CYANOCOBALAMIN) 1000 MCG tablet Take 1,000 mcg by mouth daily.    [provider]    Allergies Fluorescein; Latex; Neo-synephrine [phenylephrine hcl]; Phenylephrine; and Betadine [povidone iodine]  Family History  Problem Relation Age of Onset  . Dementia Mother   . Dementia Sister   . Breast cancer Neg Hx     Social History Social History   Tobacco Use  . Smoking status: Former Research scientist (life sciences)  . Smokeless tobacco: Never Used  Substance Use Topics  . Alcohol use: No  . Drug use: No    Review of Systems  Constitutional: Negative for fever. Eyes: Negative for visual changes. ENT: Negative for sore throat. Neck: No neck pain  Cardiovascular: Negative for chest pain. Respiratory: Negative for shortness of breath. Gastrointestinal: Negative for abdominal pain, vomiting or diarrhea. Genitourinary: Negative for dysuria. Musculoskeletal: Negative for back pain. Skin: Negative for  rash. Neurological: Negative for headaches, weakness or numbness. Psych: + SI and hallucinations. No HI  ____________________________________________   PHYSICAL EXAM:  VITAL SIGNS: ED Triage Vitals  Enc Vitals Group     BP 10/23/18 1848 (!) 153/62     Pulse Rate 10/23/18 1848 66     Resp 10/23/18 1848 20     Temp 10/23/18 1848 98 F (36.7 C)     Temp Source 10/23/18 1848 Oral     SpO2 10/23/18 1848 94 %     Weight 10/23/18 1849 122 lb (55.3 kg)     Height 10/23/18 1849 5' 2"  (1.575 m)     Head Circumference --      [facility] [place] --      Pain Score 10/23/18 1858 0     Pain Loc --      Pain Edu? --      Excl. in [place]? --  Constitutional: Alert and oriented. Well appearing and in no apparent distress. HEENT:      Head: Normocephalic and atraumatic.         Eyes: Conjunctivae are normal. Sclera is non-icteric.       Mouth/Throat: Mucous membranes are moist.       Neck: Supple with no signs of meningismus. Cardiovascular: Regular rate and rhythm. No murmurs, gallops, or rubs. 2+ symmetrical distal pulses are present in all extremities. No JVD. Respiratory: Normal respiratory effort. Lungs are clear to auscultation bilaterally. No wheezes, crackles, or rhonchi.  Gastrointestinal: Soft, non tender, and non distended with positive bowel sounds. No rebound or guarding. Genitourinary: No CVA tenderness. Musculoskeletal: Nontender with normal range of motion in all extremities. No edema, cyanosis, or erythema of extremities. Neurologic: Normal speech and language. Face is symmetric. Moving all extremities. No gross focal neurologic deficits are appreciated. Skin: Skin is warm, dry and intact. No rash noted. Psychiatric: Mood and affect are normal. Speech and behavior are normal.  ____________________________________________   LABS (all labs ordered are listed, but only abnormal results are displayed)  Labs Reviewed  ACETAMINOPHEN LEVEL - Abnormal; Notable for the following  components:      Result Value   Acetaminophen (Tylenol), Serum <10 (*)    All other components within normal limits  CBC - Abnormal; Notable for the following components:   RBC 3.62 (*)    Hemoglobin 11.9 (*)    MCV 102.5 (*)    Platelets 507 (*)    All other components within normal limits  COMPREHENSIVE METABOLIC PANEL  ETHANOL  SALICYLATE LEVEL  URINE DRUG SCREEN, QUALITATIVE ([facility] ONLY)  URINALYSIS, COMPLETE (UACMP) WITH MICROSCOPIC   ____________________________________________  EKG  ED ECG REPORT I, Rudene Re, the attending physician, personally viewed and interpreted this ECG.  Normal sinus rhythm, rate of 61, normal intervals, normal axis, no ST elevations or depressions.  Normal EKG. ____________________________________________  RADIOLOGY  I have personally reviewed the images performed during this visit and I agree with the Radiologist's read.   Interpretation by Radiologist:  Ct Head Wo Contrast  Result Date: 10/23/2018 CLINICAL DATA:  Altered mental status EXAM: CT HEAD WITHOUT CONTRAST TECHNIQUE: Contiguous axial images were obtained from the base of the skull through the vertex without intravenous contrast. COMPARISON:  09/27/2018 FINDINGS: Brain: Diffuse atrophic changes and mild chronic white matter ischemic change is again seen and stable. No findings to suggest acute hemorrhage, acute infarction or space-occupying mass lesion are seen. Vascular: No hyperdense vessel or unexpected calcification. Skull: Normal. Negative for fracture or focal lesion. Sinuses/Orbits: No acute finding. Other: None. IMPRESSION: Chronic atrophic and ischemic changes without acute abnormality. Electronically Signed   By: Inez Catalina M.D.   On: 10/23/2018 19:34     ____________________________________________   PROCEDURES  Procedure(s) performed: None Procedures Critical Care performed:  None ____________________________________________   INITIAL IMPRESSION /  ASSESSMENT AND PLAN / ED COURSE   77 y.o. female with a history of vascular dementia, hypertension, hyperlipidemia, CAD, autoimmune hepatitis who presents IVC from [facility] for auditory hallucinations and suicidal ideation.  Patient is extremely sweet, calm and cooperative, she has normal vital signs, her exam is very nonfocal with intact neuro exam.  Since this is the first time the patient acknowledges having hallucinations and due to the fact that she is on blood thinners I will send her for head CT.  Will check labs for any evidence of electrolyte derangements, dehydration, urinary tract infection as possible causes of patient's symptoms.  Once medically cleared plan to consult psychiatry.      As part of my medical decision making, I reviewed the following data within the [place] notes reviewed and incorporated, Labs reviewed , EKG interpreted , Old chart reviewed, Radiograph reviewed , A consult was requested and obtained from this/these consultant(s) TTS and psychiatry, Notes from prior ED visits and [place] Controlled Substance Database    Pertinent labs & imaging results that were available during my care of the patient were reviewed by me and considered in my medical decision making (see chart for details).    ____________________________________________   FINAL CLINICAL IMPRESSION(S) / ED DIAGNOSES  Final diagnoses:  Suicidal ideation  Hallucinations      NEW MEDICATIONS STARTED DURING THIS VISIT:  ED Discharge Orders    None       Note:  This document was prepared using Dragon voice recognition software and may include unintentional dictation errors.    Rudene Re, MD 10/23/18 [phone]

## 2018-10-23 NOTE — ED Notes (Signed)
Patients daughter([name] Albright) left phone # [phone].

## 2018-10-23 NOTE — ED Triage Notes (Signed)
Patient to ER from home via [employer] with IVC papers in hand. Patient states she doesn't know what she is here for today. IVC papers state patient has called police department several times stating there are people in her attic. Also wrote a suicide note two weeks ago (was recently sent to [facility] after pelvic fx, but is currently back home). Denies any SI today.

## 2018-10-23 NOTE — BH Assessment (Signed)
Assessment Note  Megan Baker is an 77 y.o. female who presents to the ER due to her daughter having concerns about her mental health. Patient states she has been depressed "for a long time" but was unable to give a time period. Patient admits to having suicidal thoughts and attempted to end her life several weeks ago. She states she was [place] and d[place] herself. She wrote a note but when she about to leave the house, she fell and broke her hip.    Per the report of the patient's daughter (Megan Baker-[phone]), the patient has been depressed for several years, after her son passed in 61. Patient son had several health problems and died due to heart failure. Patient inherited his home and was living there, alone independently until recently. She moved in with her daughter because of the concerns her family had. Patient daughter also states, the patient has been responding to internal stimuli and that started approximately a week ago. She has been reporting people are in the house trying to rape her and harm her. Today (10/23/2018), when she was with her daughter and she was seeing someone masturbating in front of her and was confused as to why the daughter wasn't moving out of the way but allowing the person to do it in front of her.  Patient walk with a walker and is able to perform he ADL's. Patient was in a facility due to the fall, for short period of time. When she was discharged, she continued to expressed thoughts of SI and have symptoms of depression.   During the interview, the patient was calm, cooperative and pleasant. She was able to provided appropriate answers to the questions. She still voices SI and was unable to give protective factors as to why she would not try to end her life in the future. Patient and family reports she have not history of violence or aggression.  Diagnosis: Depression  Past Medical History:  Past Medical History:   Diagnosis Date  . Allergic rhinitis   . Arthritis   . Autoimmune hepatitis ([place])   . Avascular necrosis of bone of hip, right ([place])   . Burning mouth syndrome   . Celiac sprue   . Connective tissue disease ([place])   . COPD (chronic obstructive pulmonary disease) ([place])   . Coronary artery disease   . Depression   . Gall stones   . Hyperlipidemia   . Hypertension   . Pelvic prolapse   . Psoriasis     Past Surgical History:  Procedure Laterality Date  . AMPUTATION FINGER    . CORONARY ANGIOPLASTY WITH STENT PLACEMENT    . ESOPHAGOGASTRODUODENOSCOPY (EGD) WITH PROPOFOL N/A 10/08/2016   Procedure: ESOPHAGOGASTRODUODENOSCOPY (EGD) WITH PROPOFOL;  Surgeon: Lollie Sails, MD;  Location[facility] ENDOSCOPY;  Service: Endoscopy;  Laterality: N/A;  . EYE SURGERY    . LIVER BIOPSY    . MUSCLE BIOPSY    . TUBAL LIGATION    . VISCERAL ANGIOGRAPHY N/A 04/16/2018   Procedure: VISCERAL ANGIOGRAPHY;  Surgeon: Algernon Huxley, MD;  Location: [place] CV LAB;  Service: Cardiovascular;  Laterality: N/A;    Family History:  Family History  Problem Relation Age of Onset  . Dementia Mother   . Dementia Sister   . Breast cancer Neg Hx     Social History:  reports that she has quit smoking. She has never used smokeless tobacco. She reports that she does not  drink alcohol or use drugs.  Additional Social History:  Alcohol / Drug Use Pain Medications: See PTA Prescriptions: See PTA Over the Counter: See PTA History of alcohol / drug use?: No history of alcohol / drug abuse Longest period of sobriety (when/how long): Reports of none Negative Consequences of Use: (n/a) Withdrawal Symptoms: (n/a)  CIWA: CIWA-Ar BP: (!) 153/62 Pulse Rate: 66 COWS:    Allergies:  Allergies  Allergen Reactions  . Fluorescein Other (See Comments)    intolerance  . Latex   . Neo-Synephrine [Phenylephrine Hcl]   . Phenylephrine   . Betadine [Povidone Iodine] Rash    Home Medications:  (Not in a  hospital admission)  OB/GYN Status:  No LMP recorded. Patient is postmenopausal.  General Assessment Data Location of Assessment: [facility] ED TTS Assessment: In system Is this a Tele or Face-to-Face Assessment?: Face-to-Face Is this an Initial Assessment or a Re-assessment for this encounter?: Initial Assessment Patient Accompanied by:: N/A Language Other than English: No Living Arrangements: Other (Comment) What gender do you identify as?: Female Marital status: Single Pregnancy Status: No Living Arrangements: Alone Can pt return to current living arrangement?: Yes Admission Status: Involuntary Petitioner: Family member Is patient capable of signing voluntary admission?: No Referral Source: Self/Family/Friend Insurance type: [place] Screening Exam ([place]) Medical Exam completed: Yes  Crisis Care Plan Living Arrangements: Alone Name of Psychiatrist: Reports of none Name of Therapist: Reports of none  Education Status Is patient currently in school?: No Is the patient employed, unemployed or receiving disability?: Unemployed  Risk to self with the past 6 months Suicidal Ideation: No-Not Currently/Within Last 6 Months Has patient been a risk to self within the past 6 months prior to admission? : Yes Suicidal Intent: No-Not Currently/Within Last 6 Months Has patient had any suicidal intent within the past 6 months prior to admission? : Yes Is patient at risk for suicide?: Yes Suicidal Plan?: No-Not Currently/Within Last 6 Months Has patient had any suicidal plan within the past 6 months prior to admission? : Yes Access to Means: Yes What has been your use of drugs/alcohol within the last 12 months?: [place] Previous Attempts/Gestures: No How many times?: 0 Other Self Harm Risks: Reports of none Triggers for Past Attempts: Unknown Intentional Self Injurious Behavior: None Family Suicide History: No Recent stressful life event(s): Loss  (Comment), Other (Comment) Persecutory voices/beliefs?: No Depression: Yes Depression Symptoms: Feeling worthless/self pity, Loss of interest in usual pleasures, Guilt, Isolating, Tearfulness Substance abuse history and/or treatment for substance abuse?: No Suicide prevention information given to non-admitted patients: Not applicable  Risk to Others within the past 6 months Homicidal Ideation: No Does patient have any lifetime risk of violence toward others beyond the six months prior to admission? : No Thoughts of Harm to Others: No Current Homicidal Intent: No Current Homicidal Plan: No Access to Homicidal Means: No Identified Victim: Reports of none History of harm to others?: No Assessment of Violence: None Noted Violent Behavior Description: Reports of none Does patient have access to weapons?: No Criminal Charges Pending?: No Does patient have a court date: No Is patient on probation?: No  Psychosis Hallucinations: Visual, Auditory Delusions: None noted  Mental Status Report Appearance/Hygiene: Unremarkable, In scrubs Eye Contact: Good Motor Activity: Unremarkable, Freedom of movement Speech: Logical/coherent, Unremarkable Level of Consciousness: Alert Mood: Anxious, Sad, Pleasant Affect: Sad, Appropriate to circumstance Anxiety Level: Minimal Thought Processes: Coherent, Relevant Judgement: Unimpaired Orientation: Person, Place, Time, Situation, Appropriate  for developmental age Obsessive Compulsive Thoughts/Behaviors: Minimal  Cognitive Functioning Concentration: Normal Memory: Recent Intact, Remote Intact Is patient IDD: No Insight: Fair Impulse Control: Poor Appetite: Good Have you had any weight changes? : No Change Sleep: No Change Total Hours of Sleep: 8 Vegetative Symptoms: None  ADLScreening [facility] Assessment Services) Patient's cognitive ability adequate to safely complete daily activities?: Yes Patient able to express need for assistance with  ADLs?: Yes Independently performs ADLs?: Yes (appropriate for developmental age)  Prior Inpatient Therapy Prior Inpatient Therapy: No  Prior Outpatient Therapy Prior Outpatient Therapy: No Does patient have an ACCT team?: No Does patient have Intensive In-House Services?  : No Does patient have [facility] services? : No Does patient have P4CC services?: No  ADL Screening (condition at time of admission) Patient's cognitive ability adequate to safely complete daily activities?: Yes Is the patient deaf or have difficulty hearing?: No Does the patient have difficulty seeing, even when wearing glasses/contacts?: Yes Does the patient have difficulty concentrating, remembering, or making decisions?: Yes Patient able to express need for assistance with ADLs?: Yes Does the patient have difficulty dressing or bathing?: No Independently performs ADLs?: Yes (appropriate for developmental age) Does the patient have difficulty walking or climbing stairs?: Yes Weakness of Legs: Both Weakness of Arms/Hands: None  Home Assistive Devices/Equipment Home Assistive Devices/Equipment: None  Therapy Consults (therapy consults require a physician order) PT Evaluation Needed: No OT Evalulation Needed: No SLP Evaluation Needed: No Abuse/Neglect Assessment (Assessment to be complete while patient is alone) Abuse/Neglect Assessment Can Be Completed: Yes Physical Abuse: Denies Verbal Abuse: Denies Sexual Abuse: Denies Exploitation of patient/patient's resources: Denies Self-Neglect: Denies Values / Beliefs Cultural Requests During Hospitalization: None Spiritual Requests During Hospitalization: None Consults Spiritual Care Consult Needed: No Social Work Consult Needed: No Regulatory affairs officer (For Healthcare) Does Patient Have a Medical Advance Directive?: No Would patient like information on creating a medical advance directive?: No - Patient declined       Child/Adolescent Assessment Running  Away Risk: Denies(Patient is an adult)  Disposition:  Disposition Initial Assessment Completed for this Encounter: Yes  On Site Evaluation by:   Reviewed with Physician:    Gunnar Fusi MS, LCAS, [place], [place], CCSI Therapeutic Triage Specialist 10/23/2018 9:25 PM

## 2018-10-23 NOTE — BH Assessment (Addendum)
Writer discussed patient with ER MD (Dr. Alfred Levins) and agreed their is no need for [facility] because it's clear patient meets inpatient criteria, due to multiple high risk factors. Patient have a plan and means. She have made an attempt in recent past and continues to voice SI. Patient is unable to provide protective factors to reduce the risks of self-harm.  Spoke with patient's daughter (Agnela-[phone]) and informed her of the recommendation for inpatient treatment.   Informed her of the process of a patient been placed at another facility.   Provided her with phone numbers for TTS and ER Sectary in the event family had other questions and wanted updated. As well as informing them of the need of having password and education them as to why it is used.

## 2018-10-23 NOTE — ED Notes (Signed)
Pass code given to daughter. [id number]

## 2018-10-23 NOTE — ED Notes (Signed)
TTS talking to patient at this time.

## 2018-10-23 NOTE — ED Notes (Signed)
FIRST NURSE NOTE: Pt arrived with [employer] officer with IVC paperwork, pt using rolling walker. Per IVC paperwork, pt thinks people are in her attic and wrote a suicide note 2 weeks ago. Pt comes from [place].

## 2018-10-24 ENCOUNTER — Emergency Department: Payer: Medicare Other

## 2018-10-24 LAB — URINE DRUG SCREEN, QUALITATIVE (ARMC ONLY)
Amphetamines, Ur Screen: NOT DETECTED
Barbiturates, Ur Screen: NOT DETECTED
Benzodiazepine, Ur Scrn: NOT DETECTED
CANNABINOID 50 NG, UR ~~LOC~~: NOT DETECTED
COCAINE METABOLITE, UR ~~LOC~~: NOT DETECTED
MDMA (ECSTASY) UR SCREEN: NOT DETECTED
METHADONE SCREEN, URINE: NOT DETECTED
OPIATE, UR SCREEN: NOT DETECTED
Phencyclidine (PCP) Ur S: NOT DETECTED
Tricyclic, Ur Screen: NOT DETECTED

## 2018-10-24 LAB — URINALYSIS, COMPLETE (UACMP) WITH MICROSCOPIC
BACTERIA UA: NONE SEEN
Bilirubin Urine: NEGATIVE
Glucose, UA: NEGATIVE mg/dL
Hgb urine dipstick: NEGATIVE
Ketones, ur: NEGATIVE mg/dL
NITRITE: NEGATIVE
PH: 6 (ref 5.0–8.0)
Protein, ur: NEGATIVE mg/dL
Specific Gravity, Urine: 1.009 (ref 1.005–1.030)

## 2018-10-24 MED ORDER — TRIAMTERENE-HCTZ 37.5-25 MG PO CAPS
1.0000 | ORAL_CAPSULE | Freq: Every day | ORAL | Status: DC
  Filled 2018-10-24: qty 1

## 2018-10-24 MED ORDER — SIMVASTATIN 10 MG PO TABS
20.0000 mg | ORAL_TABLET | Freq: Every day | ORAL | Status: DC
  Administered 2018-10-24: 20 mg via ORAL
  Filled 2018-10-24: qty 2

## 2018-10-24 MED ORDER — ASPIRIN EC 81 MG PO TBEC
81.0000 mg | DELAYED_RELEASE_TABLET | Freq: Every day | ORAL | Status: DC
  Administered 2018-10-24: 81 mg via ORAL
  Filled 2018-10-24: qty 1

## 2018-10-24 MED ORDER — HYDROXYZINE HCL 25 MG PO TABS
ORAL_TABLET | ORAL | Status: AC
  Filled 2018-10-24: qty 1

## 2018-10-24 MED ORDER — METOPROLOL SUCCINATE ER 50 MG PO TB24
100.0000 mg | ORAL_TABLET | Freq: Every day | ORAL | Status: DC
  Administered 2018-10-24: 100 mg via ORAL
  Filled 2018-10-24: qty 2

## 2018-10-24 MED ORDER — TRIAMTERENE-HCTZ 37.5-25 MG PO TABS
1.0000 | ORAL_TABLET | Freq: Every day | ORAL | Status: DC
  Administered 2018-10-24: 1 via ORAL
  Filled 2018-10-24: qty 1

## 2018-10-24 MED ORDER — TRAZODONE HCL 50 MG PO TABS: 50.0000 mg | ORAL_TABLET | Freq: Every day | ORAL | Status: DC

## 2018-10-24 MED ORDER — HYDROXYZINE HCL 25 MG PO TABS
25.0000 mg | ORAL_TABLET | Freq: Once | ORAL | Status: AC
  Administered 2018-10-24: 25 mg via ORAL

## 2018-10-24 MED ORDER — BUPROPION HCL ER (XL) 150 MG PO TB24
300.0000 mg | ORAL_TABLET | Freq: Every day | ORAL | Status: DC
  Administered 2018-10-24: 300 mg via ORAL
  Filled 2018-10-24: qty 2

## 2018-10-24 NOTE — ED Notes (Signed)
Attempted to call report to [facility] two times and staff will not take report until the sheriff is here.

## 2018-10-24 NOTE — ED Notes (Signed)
Pt waiting on transport to [facility]. Daughter made aware of pt transfer today by TTS earlier this morning.  Pt denies needs at this time.

## 2018-10-24 NOTE — ED Notes (Signed)
Pt comes out of room and reports that she thinks she just heard her daughter outside. Easily redirected and reassured back into room.

## 2018-10-24 NOTE — BH Assessment (Addendum)
Per Jacqulynn Cadet  at [facility] [phone]), patient is under review for gero psych placement.

## 2018-10-24 NOTE — ED Notes (Signed)
[facility] to [facility], [place], [place]

## 2018-10-24 NOTE — ED Notes (Signed)
Pt daughter called and began raising her voice asking if "we knew the difference between schizophrenia and dementia" because "she does not have schizophrenia and dementia runs in her family". She does not want her placed anywhere because "she is fine" and she was advised to come down here and speak to her doctor.

## 2018-10-24 NOTE — ED Notes (Addendum)
Pt up to bathroom. Pt still does not want breakfast, but requests some crackers.

## 2018-10-24 NOTE — ED Notes (Signed)
TTS notified of pt daughter coming. They will speak to her in family room upon arrival.

## 2018-10-24 NOTE — ED Notes (Signed)
Pt family spoke with TTS and are understanding of situation now per TTS.

## 2018-10-24 NOTE — BH Assessment (Addendum)
Per Caryl Pina at [facility] [phone] or [phone]), patient is under review for inpatient gero psych placement.

## 2018-10-24 NOTE — ED Notes (Signed)
emtala reviewed by this RN 

## 2018-10-24 NOTE — ED Notes (Signed)
Breakfast tray given to pt, but pt is not hungry.

## 2018-10-24 NOTE — BH Assessment (Signed)
Referral information for Psychiatric Hospitalization faxed to;   [facility] [phone]),   . [facility] [phone]),  . [facility] [phone], [phone], [phone] or [phone]),   . [facility] [phone]),   . [facility] [phone]),   . [address]Lurena Joiner [phone]),   . [facility] [phone] or [phone])  . [facility] [phone] or [phone]),   . [facility] [phone]).

## 2018-10-24 NOTE — ED Notes (Signed)
Officer given her belongings.

## 2018-10-24 NOTE — ED Notes (Signed)
Pt given meal tray. She reports not wanting it. She also has not eaten the crackers or breakfast tray.

## 2018-10-24 NOTE — BH Assessment (Signed)
This Probation officer contacted patient's daughter Cherylann Parr at [phone] to inform her patient has left [facility] and is being transported to [facility].

## 2018-10-24 NOTE — BH Assessment (Addendum)
This writer met with patients daughter Michele and grandson in regards to patients care. Writer explained to family that patient is endorsing depression and psychosis therefore has been IVC'd and will be transported to [facility] in [place], [place]. Family expressed understanding and agreement with treatment. Writer provided family with the following information:   Patient has been accepted to [facility] Patient assigned to 5th floor Gero Psych Unit Accepting physician is Dr. Latt.  Call report to [phone].  Representative was Jeffrey   ER Staff is aware of it:  Luann: ER Secretary  Dr. Lord: ER MD  Jessica: Patient's Nurse     Patient's Family/Support System ([name] Albright [phone]) have been updated as well.   Address: [address], [place], [place] [address] 

## 2018-10-24 NOTE — ED Provider Notes (Addendum)
I ordered home meds from pharmacy tech updated list.   Lisa Roca, MD 10/24/18 3267  Peyton Bottoms, TTS, advised patient accepted to geripsych at [facility], with accepting physician Dr. Leslee Home.    Lisa Roca, MD 10/24/18 [phone]

## 2018-10-24 NOTE — ED Notes (Signed)
Pt. Asked tech "Why am I here".  This Probation officer spoke with patient.  Pt. Wanted to know what was next for her.  Pt. Advised that she was under Involuntary commitment and we would like her to talk to a therapist and see if we could get you feeling better.  Pt. At first did not feel safe here.  Pt. Advised we have a security guard outside of her door.  Pt. Also advised that this nurse or other staff may look into room just to ensure your safety.  Pt. Asked "what time it was", pt was advised of time.  Pt. Asked if she would like a sleep aid.  Pt. Indicated she would like that.

## 2018-10-24 NOTE — ED Notes (Signed)
Pt unable to sign transfer consent due to IVC status

## 2018-10-24 NOTE — BH Assessment (Signed)
Patient has been accepted to [facility] Patient assigned to 5th floor [place] Unit Accepting physician is Dr. Leslee Home.  Call report to [phone].  Representative was [facility]   ER Staff is aware of it:  Luann: ER Secretary  Dr. Reita Cliche: ER MD  Janett Billow: Patient's Nurse     Patient's Family/Support System Legrand Rams [phone]) have been updated as well.   Address: [address] [place], [place] [address]

## 2018-11-10 ENCOUNTER — Encounter: Payer: Self-pay | Admitting: Intensive Care

## 2018-11-10 ENCOUNTER — Other Ambulatory Visit: Payer: Self-pay

## 2018-11-10 ENCOUNTER — Emergency Department
Admission: EM | Admit: 2018-11-10 | Discharge: 2018-11-10 | Disposition: A | Discharge status: Home / Self Care | Payer: Medicare Other | Attending: Emergency Medicine | Admitting: Emergency Medicine

## 2018-11-10 DIAGNOSIS — F329 Major depressive disorder, single episode, unspecified: Secondary | ICD-10-CM | POA: Diagnosis not present

## 2018-11-10 DIAGNOSIS — Z91040 Latex allergy status: Secondary | ICD-10-CM | POA: Diagnosis not present

## 2018-11-10 DIAGNOSIS — I10 Essential (primary) hypertension: Secondary | ICD-10-CM | POA: Insufficient documentation

## 2018-11-10 DIAGNOSIS — Z79899 Other long term (current) drug therapy: Secondary | ICD-10-CM | POA: Insufficient documentation

## 2018-11-10 DIAGNOSIS — Z87891 Personal history of nicotine dependence: Secondary | ICD-10-CM | POA: Insufficient documentation

## 2018-11-10 DIAGNOSIS — I2510 Atherosclerotic heart disease of native coronary artery without angina pectoris: Secondary | ICD-10-CM | POA: Insufficient documentation

## 2018-11-10 DIAGNOSIS — Z7902 Long term (current) use of antithrombotics/antiplatelets: Secondary | ICD-10-CM | POA: Insufficient documentation

## 2018-11-10 DIAGNOSIS — J449 Chronic obstructive pulmonary disease, unspecified: Secondary | ICD-10-CM | POA: Insufficient documentation

## 2018-11-10 DIAGNOSIS — Z7982 Long term (current) use of aspirin: Secondary | ICD-10-CM | POA: Diagnosis not present

## 2018-11-10 DIAGNOSIS — Z955 Presence of coronary angioplasty implant and graft: Secondary | ICD-10-CM | POA: Diagnosis not present

## 2018-11-10 DIAGNOSIS — R040 Epistaxis: Secondary | ICD-10-CM

## 2018-11-10 LAB — CBC WITH DIFFERENTIAL/PLATELET
Abs Immature Granulocytes: 0.01 10*3/uL (ref 0.00–0.07)
BASOS ABS: 0 10*3/uL (ref 0.0–0.1)
Basophils Relative: 1 %
EOS ABS: 0.3 10*3/uL (ref 0.0–0.5)
EOS PCT: 6 %
HCT: 34.4 % — ABNORMAL LOW (ref 36.0–46.0)
Hemoglobin: 11.4 g/dL — ABNORMAL LOW (ref 12.0–15.0)
Immature Granulocytes: 0 %
LYMPHS ABS: 1.9 10*3/uL (ref 0.7–4.0)
Lymphocytes Relative: 35 %
MCH: 33.2 pg (ref 26.0–34.0)
MCHC: 33.1 g/dL (ref 30.0–36.0)
MCV: 100.3 fL — ABNORMAL HIGH (ref 80.0–100.0)
Monocytes Absolute: 0.7 10*3/uL (ref 0.1–1.0)
Monocytes Relative: 14 %
NRBC: 0 % (ref 0.0–0.2)
Neutro Abs: 2.4 10*3/uL (ref 1.7–7.7)
Neutrophils Relative %: 44 %
Platelets: 387 10*3/uL (ref 150–400)
RBC: 3.43 MIL/uL — AB (ref 3.87–5.11)
RDW: 12.4 % (ref 11.5–15.5)
WBC: 5.3 10*3/uL (ref 4.0–10.5)

## 2018-11-10 MED ORDER — SILVER NITRATE-POT NITRATE 75-25 % EX MISC
CUTANEOUS | Status: AC
  Administered 2018-11-10: 1 via TOPICAL
  Filled 2018-11-10: qty 1

## 2018-11-10 MED ORDER — OXYMETAZOLINE HCL 0.05 % NA SOLN
1.0000 | Freq: Once | NASAL | Status: AC
  Administered 2018-11-10: 1 via NASAL
  Filled 2018-11-10: qty 15

## 2018-11-10 MED ORDER — SILVER NITRATE-POT NITRATE 75-25 % EX MISC
1.0000 | Freq: Once | CUTANEOUS | Status: AC
  Administered 2018-11-10: 1 via TOPICAL

## 2018-11-10 NOTE — ED Notes (Signed)
ED Provider at bedside. 

## 2018-11-10 NOTE — ED Notes (Signed)
Patient took nose clamp off and Right nostril continued bleeding again. Patients clamp replaced and MD norman reported give afrin and draw CBC

## 2018-11-10 NOTE — ED Provider Notes (Signed)
[facility] Emergency Department Provider Note ____________________________________________   I have reviewed the triage vital signs and the triage nursing note.  HISTORY  Chief Complaint Epistaxis   Historian Patient and daughter  HPI Megan Baker is a 77 y.o. female who takes aspirin and Plavix for history of coronary artery disease with stent, presents to the ED for nosebleed which started this morning.  States he was coming out of both nares.  She also had blood coming out of her left eye.  Stopped just as she got to the ER.  She has not had a history of nosebleeds.  No nasal trauma.  States that she has had some dry itchy eyes and she is questioning whether or not she is allergic to her daughter's rabbit.     Past Medical History:  Diagnosis Date  . Allergic rhinitis   . Arthritis   . Autoimmune hepatitis ([place])   . Avascular necrosis of bone of hip, right ([place])   . Burning mouth syndrome   . Celiac sprue   . Connective tissue disease ([place])   . COPD (chronic obstructive pulmonary disease) ([place])   . Coronary artery disease   . Depression   . Gall stones   . Hyperlipidemia   . Hypertension   . Pelvic prolapse   . Psoriasis     Patient Active Problem List   Diagnosis Date Noted  . Pelvic fracture ([place]) 09/27/2018  . Hyperlipidemia 03/10/2018  . Hypertension 03/10/2018  . Chronic mesenteric ischemia ([place]) 03/10/2018    Past Surgical History:  Procedure Laterality Date  . AMPUTATION FINGER    . CORONARY ANGIOPLASTY WITH STENT PLACEMENT    . ESOPHAGOGASTRODUODENOSCOPY (EGD) WITH PROPOFOL N/A 10/08/2016   Procedure: ESOPHAGOGASTRODUODENOSCOPY (EGD) WITH PROPOFOL;  Surgeon: Lollie Sails, MD;  Location: [facility] ENDOSCOPY;  Service: Endoscopy;  Laterality: N/A;  . EYE SURGERY    . LIVER BIOPSY    . MUSCLE BIOPSY    . TUBAL LIGATION    . VISCERAL ANGIOGRAPHY N/A 04/16/2018   Procedure: VISCERAL ANGIOGRAPHY;  Surgeon: Algernon Huxley,  MD;  Location: [facility] CV LAB;  Service: Cardiovascular;  Laterality: N/A;    Prior to Admission medications   Medication Sig Start Date End Date Taking? Authorizing Provider  acetaminophen (TYLENOL) 325 MG tablet Take 2 tablets (650 mg total) by mouth every 6 (six) hours as needed for mild pain (or Fever >/= 101). 09/29/18   Dustin Flock, MD  albuterol (PROVENTIL HFA;VENTOLIN HFA) 108 (90 Base) MCG/ACT inhaler Inhale into the lungs every 6 (six) hours as needed for wheezing or shortness of breath.    [provider]  alendronate (FOSAMAX) 70 MG tablet Take 70 mg by mouth once a week. Take with a full glass of water on an empty stomach.    [provider]  aspirin EC 81 MG tablet Take 1 tablet (81 mg total) by mouth daily. 04/16/18   Algernon Huxley, MD  azaTHIOprine (IMURAN) 50 MG tablet Take 50 mg by mouth daily.    [provider]  buPROPion (WELLBUTRIN XL) 300 MG 24 hr tablet Take 300 mg by mouth daily.    [provider]  calcium carbonate (OSCAL) 1500 (600 Ca) MG TABS tablet Take 1,500 mg by mouth 2 (two) times daily with a meal.    [provider]  clopidogrel (PLAVIX) 75 MG tablet Take 1 tablet (75 mg total) by mouth daily. 04/16/18   Algernon Huxley, MD  fluticasone (FLOVENT  HFA) 110 MCG/ACT inhaler Inhale 1 puff into the lungs 2 (two) times daily.    [provider]  metoprolol succinate (TOPROL-XL) 100 MG 24 hr tablet Take 100 mg by mouth daily. Take with or immediately following a meal.    [provider]  multivitamin-iron-minerals-folic acid (CENTRUM) chewable tablet Chew 1 tablet by mouth daily.    [provider]  nystatin cream (MYCOSTATIN)  07/21/15   [provider]  olmesartan (BENICAR) 40 MG tablet Take 40 mg by mouth daily.    [provider]  omeprazole (PRILOSEC) 20 MG capsule Take 20 mg by mouth 2 (two) times daily before a meal.    [provider]  oxyCODONE (OXY  IR/ROXICODONE) 5 MG immediate release tablet Take 1 tablet (5 mg total) by mouth every 4 (four) hours as needed for moderate pain. 09/29/18   Dustin Flock, MD  QUEtiapine (SEROQUEL) 25 MG tablet Take 25 mg by mouth at bedtime.    [provider]  simvastatin (ZOCOR) 20 MG tablet Take 20 mg by mouth daily.    [provider]  traZODone (DESYREL) 50 MG tablet Take 50 mg by mouth at bedtime.    [provider]  triamterene-hydrochlorothiazide (DYAZIDE) 37.5-25 MG capsule Take 1 capsule by mouth daily.    [provider]  vitamin B-12 (CYANOCOBALAMIN) 1000 MCG tablet Take 1,000 mcg by mouth daily.    [provider]    Allergies  Allergen Reactions  . Fluorescein Other (See Comments)    intolerance  . Latex   . Neo-Synephrine [Phenylephrine Hcl]   . Phenylephrine   . Betadine [Povidone Iodine] Rash    Family History  Problem Relation Age of Onset  . Dementia Mother   . Dementia Sister   . Breast cancer Neg Hx     Social History Social History   Tobacco Use  . Smoking status: Former Research scientist (life sciences)  . Smokeless tobacco: Never Used  Substance Use Topics  . Alcohol use: No  . Drug use: No    Review of Systems  Constitutional: Negative for fever. Eyes: Negative for visual changes. ENT: Negative for sore throat.  Positive for nosebleed as per HPI. Cardiovascular: Negative for chest pain. Respiratory: Negative for shortness of breath. Gastrointestinal: Negative for abdominal pain, vomiting and diarrhea. Genitourinary:  Musculoskeletal: Negative for back pain. Skin: Negative for rash. Neurological: Negative for headache.  ____________________________________________   PHYSICAL EXAM:  VITAL SIGNS: ED Triage Vitals  Enc Vitals Group     BP 11/10/18 1022 131/65     Pulse Rate 11/10/18 1022 94     Resp 11/10/18 1022 16     Temp 11/10/18 1022 98.3 F (36.8 C)     Temp Source 11/10/18 1022 Oral     SpO2 11/10/18 1026 99 %     Weight  11/10/18 1025 122 lb (55.3 kg)     Height 11/10/18 1025 5' 2"  (1.575 m)     Head Circumference --      Peak Flow --      Pain Score 11/10/18 1025 0     Pain Loc --      Pain Edu? --      Excl. in [place]? --      Constitutional: Alert and oriented.  HEENT      Head: Normocephalic and atraumatic.      Eyes: Conjunctivae are normal. Pupils equal and round.  Dried blood around the left eye skin.      Ears:  Nose: Hyperemia of the right anterior nasal septum.  Hyperemia of the left anterior nasal septum with a small area of blood clot at the left anterior nasal septum.      Mouth/Throat: Mucous membranes are moist.      Neck: No stridor. Cardiovascular/Chest: Normal rate, regular rhythm.  No murmurs, rubs, or gallops. Respiratory: Normal respiratory effort without tachypnea nor retractions. Breath sounds are clear and equal bilaterally. No wheezes/rales/rhonchi. Gastrointestinal: Soft. No distention, no guarding, no rebound. Nontender.    Genitourinary/rectal:Deferred Musculoskeletal: Normal extremity's. Neurologic:  Normal speech and language. No gross or focal neurologic deficits are appreciated. Skin:  Skin is warm, dry and intact. No rash noted. Psychiatric: Mood and affect are normal. Speech and behavior are normal. Patient exhibits appropriate insight and judgment.   ____________________________________________  LABS (pertinent positives/negatives) I, Lisa Roca, MD the attending physician have reviewed the labs noted below.  Labs Reviewed  CBC WITH DIFFERENTIAL/PLATELET - Abnormal; Notable for the following components:      Result Value   RBC 3.43 (*)    Hemoglobin 11.4 (*)    HCT 34.4 (*)    MCV 100.3 (*)    All other components within normal limits    ____________________________________________    EKG I, Lisa Roca, MD, the attending physician have personally viewed and interpreted all  ECGs.  None ____________________________________________  RADIOLOGY   None __________________________________________  PROCEDURES  Procedure(s) performed: Silver nitrate cautery, left anterior nasal septum.  No complications.  Procedures  Critical Care performed: None   ____________________________________________  ED COURSE / ASSESSMENT AND PLAN  Pertinent labs & imaging results that were available during my care of the patient were reviewed by me and considered in my medical decision making (see chart for details).     Patient had Afrin sprayed in triage.  When I saw the patient there is no active bleeding however there was a small clot which is likely the location of the left-sided nosebleed.  She has evidence of having had blood come from the lacrimal duct at that side and we discussed that.  Indicates she is now no longer bleeding.  We discussed risk and benefit of holding her blood thinners aspirin and Plavix that she is on for coronary artery disease and history of stent several years ago, and we can hold it for 1 to 2 days.    CONSULTATIONS:   None   Patient / Family / Caregiver informed of clinical course, medical decision-making process, and agree with plan.   I discussed return precautions, follow-up instructions, and discharge instructions with patient and/or family.  Discharge Instructions : Return to emergency department immediately for any worsening condition including nosebleed that does not stop with pressure after about 10 minutes or so.  Return for any dizziness, chest pain, passing out, or any other symptoms concerning to you.  We discussed that it may be reasonable to stop the aspirin and Plavix as blood thinners for 1 to 2 days to allow the scab to form strongly for the nosebleed.  That you may restart.    ___________________________________________   FINAL CLINICAL IMPRESSION(S) / ED DIAGNOSES   Final diagnoses:  Epistaxis       ___________________________________________         Note: This dictation was prepared with Dragon dictation. Any transcriptional errors that result from this process are unintentional    Lisa Roca, MD 11/10/18 1342

## 2018-11-10 NOTE — Discharge Instructions (Addendum)
Return to emergency department immediately for any worsening condition including nosebleed that does not stop with pressure after about 10 minutes or so.  Return for any dizziness, chest pain, passing out, or any other symptoms concerning to you.  We discussed that it may be reasonable to stop the aspirin and Plavix as blood thinners for 1 to 2 days to allow the scab to form strongly for the nosebleed.  That you may restart.

## 2018-11-10 NOTE — ED Notes (Signed)
Pt pinched nose to get bleeding to stop, denies having stuck anything up nose. PT is on plavix as of three weeks ago. Denies having been sick recently or having any trauma to face

## 2018-11-10 NOTE — ED Triage Notes (Addendum)
Patient arrived by EMS from home for nosebleed upon awakening. Patient lives with family. Nosebleed started around 0800am. 1 dose afrin given by FD and controlled bleeding. EMS vitals 54HR, 98%, RR16, b/p 121/67. A&O x4 upon arrival to ER. Takes plavix daily

## 2018-11-24 ENCOUNTER — Other Ambulatory Visit: Payer: Self-pay

## 2018-11-24 ENCOUNTER — Encounter (INDEPENDENT_AMBULATORY_CARE_PROVIDER_SITE_OTHER): Payer: Self-pay | Admitting: Vascular Surgery

## 2018-11-24 ENCOUNTER — Ambulatory Visit (INDEPENDENT_AMBULATORY_CARE_PROVIDER_SITE_OTHER): Payer: Medicare Other | Admitting: Vascular Surgery

## 2018-11-24 ENCOUNTER — Ambulatory Visit (INDEPENDENT_AMBULATORY_CARE_PROVIDER_SITE_OTHER): Payer: Medicare Other

## 2018-11-24 VITALS — BP 115/70 | HR 90 | Resp 18 | Ht 62.0 in | Wt 118.0 lb

## 2018-11-24 DIAGNOSIS — E785 Hyperlipidemia, unspecified: Secondary | ICD-10-CM

## 2018-11-24 DIAGNOSIS — I10 Essential (primary) hypertension: Secondary | ICD-10-CM

## 2018-11-24 DIAGNOSIS — K551 Chronic vascular disorders of intestine: Secondary | ICD-10-CM

## 2018-11-24 NOTE — Progress Notes (Signed)
MRN : [id number]  Megan Baker is a 77 y.o. (December 18, 1941) female who presents with chief complaint of  Chief Complaint  Patient presents with  . Follow-up    ultrasound  .  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  About 6 months ago, she underwent SMA angioplasty with good results.  Following that, she had resolution of her abdominal pain and food fear.  She has continued to have weight loss.  Her duplex today shows normal velocities in the celiac and superior mesenteric artery with no hemodynamically significant stenosis identified.  Current Outpatient Medications  Medication Sig Dispense Refill  . acetaminophen (TYLENOL) 325 MG tablet Take 2 tablets (650 mg total) by mouth every 6 (six) hours as needed for mild pain (or Fever >/= 101).    Marland Kitchen albuterol (PROVENTIL HFA;VENTOLIN HFA) 108 (90 Base) MCG/ACT inhaler Inhale into the lungs every 6 (six) hours as needed for wheezing or shortness of breath.    Marland Kitchen alendronate (FOSAMAX) 70 MG tablet Take 70 mg by mouth once a week. Take with a full glass of water on an empty stomach.    Marland Kitchen aspirin EC 81 MG tablet Take 1 tablet (81 mg total) by mouth daily. 150 tablet 2  . azaTHIOprine (IMURAN) 50 MG tablet Take 50 mg by mouth daily.    Marland Kitchen buPROPion (WELLBUTRIN XL) 300 MG 24 hr tablet Take 300 mg by mouth daily.    . calcium carbonate (OSCAL) 1500 (600 Ca) MG TABS tablet Take 1,500 mg by mouth 2 (two) times daily with a meal.    . clopidogrel (PLAVIX) 75 MG tablet Take 1 tablet (75 mg total) by mouth daily. 30 tablet 11  . fluticasone (FLOVENT HFA) 110 MCG/ACT inhaler Inhale 1 puff into the lungs 2 (two) times daily.    . metoprolol succinate (TOPROL-XL) 100 MG 24 hr tablet Take 100 mg by mouth daily. Take with or immediately following a meal.    . multivitamin-iron-minerals-folic acid (CENTRUM) chewable tablet Chew 1 tablet by mouth daily.    Marland Kitchen nystatin cream (MYCOSTATIN)     . olmesartan (BENICAR) 40 MG tablet  Take 40 mg by mouth daily.    Marland Kitchen omeprazole (PRILOSEC) 20 MG capsule Take 20 mg by mouth 2 (two) times daily before a meal.    . oxyCODONE (OXY IR/ROXICODONE) 5 MG immediate release tablet Take 1 tablet (5 mg total) by mouth every 4 (four) hours as needed for moderate pain. 30 tablet 0  . QUEtiapine (SEROQUEL) 25 MG tablet Take 25 mg by mouth at bedtime.    . simvastatin (ZOCOR) 20 MG tablet Take 20 mg by mouth daily.    . traZODone (DESYREL) 50 MG tablet Take 50 mg by mouth at bedtime.    . triamterene-hydrochlorothiazide (DYAZIDE) 37.5-25 MG capsule Take 1 capsule by mouth daily.    . vitamin B-12 (CYANOCOBALAMIN) 1000 MCG tablet Take 1,000 mcg by mouth daily.     No current facility-administered medications for this visit.     Past Medical History:  Diagnosis Date  . Allergic rhinitis   . Arthritis   . Autoimmune hepatitis ([place])   . Avascular necrosis of bone of hip, right ([place])   . Burning mouth syndrome   . Celiac sprue   . Connective tissue disease ([place])   . COPD (chronic obstructive pulmonary disease) ([place])   . Coronary artery disease   . Depression   . Gall stones   . Hyperlipidemia   .  Hypertension   . Pelvic prolapse   . Psoriasis     Past Surgical History:  Procedure Laterality Date  . AMPUTATION FINGER    . CORONARY ANGIOPLASTY WITH STENT PLACEMENT    . ESOPHAGOGASTRODUODENOSCOPY (EGD) WITH PROPOFOL N/A 10/08/2016   Procedure: ESOPHAGOGASTRODUODENOSCOPY (EGD) WITH PROPOFOL;  Surgeon: Lollie Sails, MD;  Location: [facility] ENDOSCOPY;  Service: Endoscopy;  Laterality: N/A;  . EYE SURGERY    . LIVER BIOPSY    . MUSCLE BIOPSY    . TUBAL LIGATION    . VISCERAL ANGIOGRAPHY N/A 04/16/2018   Procedure: VISCERAL ANGIOGRAPHY;  Surgeon: Algernon Huxley, MD;  Location: [facility] CV LAB;  Service: Cardiovascular;  Laterality: N/A;    Social History Social History   Tobacco Use  . Smoking status: Former Research scientist (life sciences)  . Smokeless tobacco: Never Used  Substance Use Topics  .  Alcohol use: No  . Drug use: No     Family History Family History  Problem Relation Age of Onset  . Dementia Mother   . Dementia Sister   . Breast cancer Neg Hx      Allergies  Allergen Reactions  . Fluorescein Other (See Comments)    intolerance  . Latex   . Neo-Synephrine [Phenylephrine Hcl]   . Phenylephrine   . Betadine [Povidone Iodine] Rash    REVIEW OF SYSTEMS (Negative unless checked)  Constitutional: [x] Weight loss  [] Fever  [] Chills Cardiac: [] Chest pain   [] Chest pressure   [] Palpitations   [] Shortness of breath when laying flat   [] Shortness of breath at rest   [] Shortness of breath with exertion. Vascular:  [] Pain in legs with walking   [] Pain in legs at rest   [] Pain in legs when laying flat   [] Claudication   [] Pain in feet when walking  [] Pain in feet at rest  [] Pain in feet when laying flat   [] History of DVT   [] Phlebitis   [] Swelling in legs   [] Varicose veins   [] Non-healing ulcers Pulmonary:   [] Uses home oxygen   [] Productive cough   [] Hemoptysis   [] Wheeze  [x] COPD   [] Asthma Neurologic:  [] Dizziness  [] Blackouts   [] Seizures   [] History of stroke   [] History of TIA  [] Aphasia   [] Temporary blindness   [] Dysphagia   [] Weakness or numbness in arms   [] Weakness or numbness in legs Musculoskeletal:  [x] Arthritis   [] Joint swelling   [] Joint pain   [] Low back pain Hematologic:  [] Easy bruising  [] Easy bleeding   [] Hypercoagulable state   [] Anemic  [] Hepatitis Gastrointestinal:  [] Blood in stool   [] Vomiting blood  [x] Gastroesophageal reflux/heartburn   [x] Abdominal pain Genitourinary:  [] Chronic kidney disease   [x] Difficult urination  [] Frequent urination  [] Burning with urination   [] Hematuria Skin:  [x] Rashes   [] Ulcers   [] Wounds Psychological:  [] History of anxiety   []  History of major depression.    Physical Examination  BP 115/70   Pulse 90   Resp 18   Ht 5' 2"  (1.575 m)   Wt 118 lb (53.5 kg)   BMI 21.58 kg/m  Gen:  WD/WN, NAD Head: [place]/AT,  No temporalis wasting. Ear/Nose/Throat: Hearing grossly intact, nares w/o erythema or drainage Eyes: Conjunctiva clear. Sclera non-icteric Neck: Supple.  Trachea midline Pulmonary:  Good air movement, no use of accessory muscles.  Cardiac: RRR, no JVD Vascular:  Vessel Right Left  Radial Palpable Palpable  Gastrointestinal: soft, non-tender/non-distended. No guarding/reflex.  Musculoskeletal: M/S 5/5 throughout.  No deformity or atrophy.  Neurologic: Sensation grossly intact in extremities.  Symmetrical.  Speech is fluent.  Psychiatric: Judgment intact, Mood & affect appropriate for pt's clinical situation. Dermatologic: No rashes or ulcers noted.  No cellulitis or open wounds.       Labs Recent Results (from the past 2160 hour(s))  CBC     Status: Abnormal   Collection Time: 09/27/18 12:13 PM  Result Value Ref Range   WBC 8.3 3.6 - 11.0 K/uL   RBC 3.67 (L) 3.80 - 5.20 MIL/uL   Hemoglobin 12.4 12.0 - 16.0 g/dL   HCT 36.7 35.0 - 47.0 %   MCV 99.8 80.0 - 100.0 fL   MCH 33.9 26.0 - 34.0 pg   MCHC 33.9 32.0 - 36.0 g/dL   RDW 13.0 11.5 - 14.5 %   Platelets 419 150 - 440 K/uL    Comment: Performed at [facility], [address] [place], [place] [address]  Basic metabolic panel     Status: Abnormal   Collection Time: 09/27/18 12:13 PM  Result Value Ref Range   Sodium 137 135 - 145 mmol/L   Potassium 3.4 (L) 3.5 - 5.1 mmol/L   Chloride 99 98 - 111 mmol/L   CO2 29 22 - 32 mmol/L   Glucose, Bld 91 70 - 99 mg/dL   BUN 9 8 - 23 mg/dL   Creatinine, Ser 1.08 (H) 0.44 - 1.00 mg/dL   Calcium 9.6 8.9 - 10.3 mg/dL   GFR calc non Af Amer 49 (L) >60 mL/min   GFR calc Af Amer 56 (L) >60 mL/min    Comment: (NOTE) The eGFR has been calculated using the CKD EPI equation. This calculation has not been validated in all clinical situations. eGFR's persistently <60 mL/min signify possible Chronic Kidney Disease.    Anion gap 9 5 - 15     Comment: Performed at [facility], [address] [place], [place] [address]  Urinalysis, Complete w Microscopic     Status: Abnormal   Collection Time: 09/27/18 12:13 PM  Result Value Ref Range   Color, Urine YELLOW (A) YELLOW   APPearance CLEAR (A) CLEAR   Specific Gravity, Urine 1.011 1.005 - 1.030   pH 5.0 5.0 - 8.0   Glucose, UA NEGATIVE NEGATIVE mg/dL   Hgb urine dipstick NEGATIVE NEGATIVE   Bilirubin Urine NEGATIVE NEGATIVE   Ketones, ur NEGATIVE NEGATIVE mg/dL   Protein, ur NEGATIVE NEGATIVE mg/dL   Nitrite NEGATIVE NEGATIVE   Leukocytes, UA MODERATE (A) NEGATIVE   RBC / HPF 0-5 0 - 5 RBC/hpf   WBC, UA 6-10 0 - 5 WBC/hpf   Bacteria, UA NONE SEEN NONE SEEN   Squamous Epithelial / LPF 6-10 0 - 5   Hyaline Casts, UA PRESENT     Comment: Performed at [facility], [address], [place], [place] [address]  Basic metabolic panel     Status: Abnormal   Collection Time: 09/28/18  3:22 AM  Result Value Ref Range   Sodium 139 135 - 145 mmol/L   Potassium 3.9 3.5 - 5.1 mmol/L   Chloride 108 98 - 111 mmol/L   CO2 27 22 - 32 mmol/L   Glucose, Bld 99 70 - 99 mg/dL   BUN 10 8 - 23 mg/dL   Creatinine, Ser 0.93 0.44 - 1.00 mg/dL   Calcium 8.5 (L) 8.9 - 10.3 mg/dL   GFR calc non Af  Amer 58 (L) >60 mL/min   GFR calc Af Amer >60 >60 mL/min    Comment: (NOTE) The eGFR has been calculated using the CKD EPI equation. This calculation has not been validated in all clinical situations. eGFR's persistently <60 mL/min signify possible Chronic Kidney Disease.    Anion gap 4 (L) 5 - 15    Comment: Performed at [facility], [address] [place], [place] [address]  CBC     Status: Abnormal   Collection Time: 09/28/18  3:22 AM  Result Value Ref Range   WBC 4.1 3.6 - 11.0 K/uL   RBC 3.15 (L) 3.80 - 5.20 MIL/uL   Hemoglobin 10.7 (L) 12.0 - 16.0 g/dL   HCT 31.4 (L) 35.0 - 47.0 %   MCV 99.7 80.0 - 100.0 fL   MCH 34.0 26.0 - 34.0 pg   MCHC 34.1 32.0 - 36.0  g/dL   RDW 13.4 11.5 - 14.5 %   Platelets 315 150 - 440 K/uL    Comment: Performed at [facility], [address] [place], [place] [address]  Comprehensive metabolic panel     Status: None   Collection Time: 10/23/18  6:55 PM  Result Value Ref Range   Sodium 139 135 - 145 mmol/L   Potassium 3.7 3.5 - 5.1 mmol/L   Chloride 103 98 - 111 mmol/L   CO2 28 22 - 32 mmol/L   Glucose, Bld 83 70 - 99 mg/dL   BUN 12 8 - 23 mg/dL   Creatinine, Ser 0.86 0.44 - 1.00 mg/dL   Calcium 9.5 8.9 - 10.3 mg/dL   Total Protein 8.0 6.5 - 8.1 g/dL   Albumin 4.2 3.5 - 5.0 g/dL   AST 22 15 - 41 U/L   ALT 12 0 - 44 U/L   Alkaline Phosphatase 67 38 - 126 U/L   Total Bilirubin 0.4 0.3 - 1.2 mg/dL   GFR calc non Af Amer >60 >60 mL/min   GFR calc Af Amer >60 >60 mL/min    Comment: (NOTE) The eGFR has been calculated using the CKD EPI equation. This calculation has not been validated in all clinical situations. eGFR's persistently <60 mL/min signify possible Chronic Kidney Disease.    Anion gap 8 5 - 15    Comment: Performed at [facility], [address] [place], [place] [address]  Ethanol     Status: None   Collection Time: 10/23/18  6:55 PM  Result Value Ref Range   Alcohol, Ethyl (B) <10 <10 mg/dL    Comment: (NOTE) Lowest detectable limit for serum alcohol is 10 mg/dL. For medical purposes only. Performed at [facility], [address] [place], [place] [address]   Salicylate level     Status: None   Collection Time: 10/23/18  6:55 PM  Result Value Ref Range   Salicylate Lvl <3.7 2.8 - 30.0 mg/dL    Comment: Performed at [facility], [address] [place], [place] [address]  Acetaminophen level     Status: Abnormal   Collection Time: 10/23/18  6:55 PM  Result Value Ref Range   Acetaminophen (Tylenol), Serum <10 (L) 10 - 30 ug/mL    Comment: (NOTE) Therapeutic concentrations vary significantly. A range of 10-30 ug/mL  may be an effective  concentration for many patients. However, some  are best treated at concentrations outside of this range. Acetaminophen concentrations >150 ug/mL at 4 hours after ingestion  and >50 ug/mL at 12 hours after ingestion are  often associated with  toxic reactions. Performed at [facility], [address] [place], [place] [address]   cbc     Status: Abnormal   Collection Time: 10/23/18  6:55 PM  Result Value Ref Range   WBC 5.3 4.0 - 10.5 K/uL   RBC 3.62 (L) 3.87 - 5.11 MIL/uL   Hemoglobin 11.9 (L) 12.0 - 15.0 g/dL   HCT 37.1 36.0 - 46.0 %   MCV 102.5 (H) 80.0 - 100.0 fL   MCH 32.9 26.0 - 34.0 pg   MCHC 32.1 30.0 - 36.0 g/dL   RDW 13.4 11.5 - 15.5 %   Platelets 507 (H) 150 - 400 K/uL   nRBC 0.0 0.0 - 0.2 %    Comment: Performed at [facility], [address], [place], [place] [address]  Urine Drug Screen, Qualitative     Status: None   Collection Time: 10/24/18 12:05 AM  Result Value Ref Range   Tricyclic, Ur Screen NONE DETECTED NONE DETECTED   Amphetamines, Ur Screen NONE DETECTED NONE DETECTED   MDMA (Ecstasy)Ur Screen NONE DETECTED NONE DETECTED   Cocaine Metabolite,Ur [place] NONE DETECTED NONE DETECTED   Opiate, Ur Screen NONE DETECTED NONE DETECTED   Phencyclidine (PCP) Ur S NONE DETECTED NONE DETECTED   Cannabinoid 50 Ng, Ur [place] NONE DETECTED NONE DETECTED   Barbiturates, Ur Screen NONE DETECTED NONE DETECTED   Benzodiazepine, Ur Scrn NONE DETECTED NONE DETECTED   Methadone Scn, Ur NONE DETECTED NONE DETECTED    Comment: (NOTE) Tricyclics + metabolites, urine    Cutoff 1000 ng/mL Amphetamines + metabolites, urine  Cutoff 1000 ng/mL MDMA (Ecstasy), urine              Cutoff 500 ng/mL Cocaine Metabolite, urine          Cutoff 300 ng/mL Opiate + metabolites, urine        Cutoff 300 ng/mL Phencyclidine (PCP), urine         Cutoff 25 ng/mL Cannabinoid, urine                 Cutoff 50 ng/mL Barbiturates + metabolites, urine  Cutoff 200 ng/mL Benzodiazepine,  urine              Cutoff 200 ng/mL Methadone, urine                   Cutoff 300 ng/mL The urine drug screen provides only a preliminary, unconfirmed analytical test result and should not be used for non-medical purposes. Clinical consideration and professional judgment should be applied to any positive drug screen result due to possible interfering substances. A more specific alternate chemical method must be used in order to obtain a confirmed analytical result. Gas chromatography / mass spectrometry (GC/MS) is the preferred confirmat ory method. Performed at [facility], [address] [place], [place] [address]   Urinalysis, Complete w Microscopic     Status: Abnormal   Collection Time: 10/24/18 12:05 AM  Result Value Ref Range   Color, Urine YELLOW (A) YELLOW   APPearance CLEAR (A) CLEAR   Specific Gravity, Urine 1.009 1.005 - 1.030   pH 6.0 5.0 - 8.0   Glucose, UA NEGATIVE NEGATIVE mg/dL   Hgb urine dipstick NEGATIVE NEGATIVE   Bilirubin Urine NEGATIVE NEGATIVE   Ketones, ur NEGATIVE NEGATIVE mg/dL   Protein, ur NEGATIVE NEGATIVE mg/dL   Nitrite NEGATIVE NEGATIVE   Leukocytes, UA SMALL (A) NEGATIVE   RBC / HPF 0-5 0 - 5  RBC/hpf   WBC, UA 0-5 0 - 5 WBC/hpf   Bacteria, UA NONE SEEN NONE SEEN   Squamous Epithelial / LPF 0-5 0 - 5    Comment: Performed at [facility], [address] [place], [place] [address]  CBC with Differential     Status: Abnormal   Collection Time: 11/10/18 11:15 AM  Result Value Ref Range   WBC 5.3 4.0 - 10.5 K/uL   RBC 3.43 (L) 3.87 - 5.11 MIL/uL   Hemoglobin 11.4 (L) 12.0 - 15.0 g/dL   HCT 34.4 (L) 36.0 - 46.0 %   MCV 100.3 (H) 80.0 - 100.0 fL   MCH 33.2 26.0 - 34.0 pg   MCHC 33.1 30.0 - 36.0 g/dL   RDW 12.4 11.5 - 15.5 %   Platelets 387 150 - 400 K/uL   nRBC 0.0 0.0 - 0.2 %   Neutrophils Relative % 44 %   Neutro Abs 2.4 1.7 - 7.7 K/uL   Lymphocytes Relative 35 %   Lymphs Abs 1.9 0.7 - 4.0 K/uL   Monocytes Relative  14 %   Monocytes Absolute 0.7 0.1 - 1.0 K/uL   Eosinophils Relative 6 %   Eosinophils Absolute 0.3 0.0 - 0.5 K/uL   Basophils Relative 1 %   Basophils Absolute 0.0 0.0 - 0.1 K/uL   Immature Granulocytes 0 %   Abs Immature Granulocytes 0.01 0.00 - 0.07 K/uL    Comment: Performed at [facility], [address] [place], [place] [address]    Radiology No results found.  Assessment/Plan Hyperlipidemia lipid control important in reducing the progression of atherosclerotic disease. Continue statin therapy   Hypertension blood pressure control important in reducing the progression of atherosclerotic disease. On appropriate oral medications.  Chronic mesenteric ischemia (HCC) Her duplex today shows normal velocities in the celiac and superior mesenteric artery with no hemodynamically significant stenosis identified. Although she is still having weight loss, her pain and food fear are much better.  Recheck in 6 months with duplex and if that looks okay we will go to an annual basis.    Leotis Pain, MD  11/24/2018 12:27 PM    This note was created with Dragon medical transcription system.  Any errors from dictation are purely unintentional

## 2018-11-24 NOTE — Assessment & Plan Note (Signed)
Her duplex today shows normal velocities in the celiac and superior mesenteric artery with no hemodynamically significant stenosis identified. Although she is still having weight loss, her pain and food fear are much better.  Recheck in 6 months with duplex and if that looks okay we will go to an annual basis.

## 2019-01-04 ENCOUNTER — Emergency Department
Admission: EM | Admit: 2019-01-04 | Discharge: 2019-01-04 | Disposition: A | Discharge status: Home / Self Care | Payer: Medicare Other | Attending: Emergency Medicine | Admitting: Emergency Medicine

## 2019-01-04 ENCOUNTER — Other Ambulatory Visit: Payer: Self-pay

## 2019-01-04 ENCOUNTER — Encounter: Payer: Self-pay | Admitting: Emergency Medicine

## 2019-01-04 DIAGNOSIS — J449 Chronic obstructive pulmonary disease, unspecified: Secondary | ICD-10-CM | POA: Insufficient documentation

## 2019-01-04 DIAGNOSIS — I10 Essential (primary) hypertension: Secondary | ICD-10-CM | POA: Diagnosis not present

## 2019-01-04 DIAGNOSIS — Z79899 Other long term (current) drug therapy: Secondary | ICD-10-CM | POA: Diagnosis not present

## 2019-01-04 DIAGNOSIS — Z7902 Long term (current) use of antithrombotics/antiplatelets: Secondary | ICD-10-CM | POA: Insufficient documentation

## 2019-01-04 DIAGNOSIS — N939 Abnormal uterine and vaginal bleeding, unspecified: Secondary | ICD-10-CM | POA: Insufficient documentation

## 2019-01-04 DIAGNOSIS — Z7982 Long term (current) use of aspirin: Secondary | ICD-10-CM | POA: Insufficient documentation

## 2019-01-04 DIAGNOSIS — Z87891 Personal history of nicotine dependence: Secondary | ICD-10-CM | POA: Insufficient documentation

## 2019-01-04 DIAGNOSIS — Z955 Presence of coronary angioplasty implant and graft: Secondary | ICD-10-CM | POA: Insufficient documentation

## 2019-01-04 DIAGNOSIS — N390 Urinary tract infection, site not specified: Secondary | ICD-10-CM | POA: Insufficient documentation

## 2019-01-04 DIAGNOSIS — Z91040 Latex allergy status: Secondary | ICD-10-CM | POA: Diagnosis not present

## 2019-01-04 LAB — URINALYSIS, COMPLETE (UACMP) WITH MICROSCOPIC
BILIRUBIN URINE: NEGATIVE
GLUCOSE, UA: NEGATIVE mg/dL
Ketones, ur: NEGATIVE mg/dL
Nitrite: NEGATIVE
Protein, ur: NEGATIVE mg/dL
SPECIFIC GRAVITY, URINE: 1.008 (ref 1.005–1.030)
pH: 6 (ref 5.0–8.0)

## 2019-01-04 MED ORDER — CEPHALEXIN 500 MG PO CAPS: 500.0000 mg | ORAL_CAPSULE | Freq: Two times a day (BID) | ORAL | 0 refills | Status: DC

## 2019-01-04 MED ORDER — CEPHALEXIN 500 MG PO CAPS
500.0000 mg | ORAL_CAPSULE | Freq: Once | ORAL | Status: AC
  Administered 2019-01-04: 500 mg via ORAL
  Filled 2019-01-04: qty 1

## 2019-01-04 NOTE — ED Provider Notes (Signed)
[facility] Emergency Department Provider Note   ____________________________________________    I have reviewed the triage vital signs and the nursing notes.   HISTORY  Chief Complaint Hematuria     HPI Megan Baker is a 78 y.o. female who presents with reports of vaginal bleeding.  Patient reports she has a history of a prolapsed bladder for which she has been seeing Dr. Leafy Ro of GYN.  She notes today she sat on the toilet to urinate and noticed some blood in the toilet, she feels this was not from her urine.  Otherwise she feels well and has no complaints   Past Medical History:  Diagnosis Date  . Allergic rhinitis   . Arthritis   . Autoimmune hepatitis ([place])   . Avascular necrosis of bone of hip, right ([place])   . Burning mouth syndrome   . Celiac sprue   . Connective tissue disease ([place])   . COPD (chronic obstructive pulmonary disease) ([place])   . Coronary artery disease   . Depression   . Gall stones   . Hyperlipidemia   . Hypertension   . Pelvic prolapse   . Psoriasis     Patient Active Problem List   Diagnosis Date Noted  . Pelvic fracture ([place]) 09/27/2018  . Hyperlipidemia 03/10/2018  . Hypertension 03/10/2018  . Chronic mesenteric ischemia ([place]) 03/10/2018    Past Surgical History:  Procedure Laterality Date  . AMPUTATION FINGER    . CORONARY ANGIOPLASTY WITH STENT PLACEMENT    . ESOPHAGOGASTRODUODENOSCOPY (EGD) WITH PROPOFOL N/A 10/08/2016   Procedure: ESOPHAGOGASTRODUODENOSCOPY (EGD) WITH PROPOFOL;  Surgeon: Lollie Sails, MD;  Location: [facility] ENDOSCOPY;  Service: Endoscopy;  Laterality: N/A;  . EYE SURGERY    . LIVER BIOPSY    . MUSCLE BIOPSY    . TUBAL LIGATION    . VISCERAL ANGIOGRAPHY N/A 04/16/2018   Procedure: VISCERAL ANGIOGRAPHY;  Surgeon: Algernon Huxley, MD;  Location: [place] CV LAB;  Service: Cardiovascular;  Laterality: N/A;    Prior to Admission medications   Medication Sig Start Date End Date  Taking? Authorizing Provider  acetaminophen (TYLENOL) 325 MG tablet Take 2 tablets (650 mg total) by mouth every 6 (six) hours as needed for mild pain (or Fever >/= 101). 09/29/18   Dustin Flock, MD  albuterol (PROVENTIL HFA;VENTOLIN HFA) 108 (90 Base) MCG/ACT inhaler Inhale into the lungs every 6 (six) hours as needed for wheezing or shortness of breath.    [provider]  alendronate (FOSAMAX) 70 MG tablet Take 70 mg by mouth once a week. Take with a full glass of water on an empty stomach.    [provider]  aspirin EC 81 MG tablet Take 1 tablet (81 mg total) by mouth daily. 04/16/18   Algernon Huxley, MD  azaTHIOprine (IMURAN) 50 MG tablet Take 50 mg by mouth daily.    [provider]  buPROPion (WELLBUTRIN XL) 300 MG 24 hr tablet Take 300 mg by mouth daily.    [provider]  calcium carbonate (OSCAL) 1500 (600 Ca) MG TABS tablet Take 1,500 mg by mouth 2 (two) times daily with a meal.    [provider]  cephALEXin (KEFLEX) 500 MG capsule Take 1 capsule (500 mg total) by mouth 2 (two) times daily. 01/04/19   Lavonia Drafts, MD  clopidogrel (PLAVIX) 75 MG tablet Take 1 tablet (75 mg total) by mouth daily. 04/16/18   Algernon Huxley, MD  fluticasone (FLOVENT HFA) 110  MCG/ACT inhaler Inhale 1 puff into the lungs 2 (two) times daily.    [provider]  metoprolol succinate (TOPROL-XL) 100 MG 24 hr tablet Take 100 mg by mouth daily. Take with or immediately following a meal.    [provider]  multivitamin-iron-minerals-folic acid (CENTRUM) chewable tablet Chew 1 tablet by mouth daily.    [provider]  nystatin cream (MYCOSTATIN)  07/21/15   [provider]  olmesartan (BENICAR) 40 MG tablet Take 40 mg by mouth daily.    [provider]  omeprazole (PRILOSEC) 20 MG capsule Take 20 mg by mouth 2 (two) times daily before a meal.    [provider]  oxyCODONE (OXY IR/ROXICODONE) 5 MG immediate release  tablet Take 1 tablet (5 mg total) by mouth every 4 (four) hours as needed for moderate pain. 09/29/18   Dustin Flock, MD  QUEtiapine (SEROQUEL) 25 MG tablet Take 25 mg by mouth at bedtime.    [provider]  simvastatin (ZOCOR) 20 MG tablet Take 20 mg by mouth daily.    [provider]  traZODone (DESYREL) 50 MG tablet Take 50 mg by mouth at bedtime.    [provider]  triamterene-hydrochlorothiazide (DYAZIDE) 37.5-25 MG capsule Take 1 capsule by mouth daily.    [provider]  vitamin B-12 (CYANOCOBALAMIN) 1000 MCG tablet Take 1,000 mcg by mouth daily.    [provider]     Allergies Fluorescein; Latex; Neo-synephrine [phenylephrine hcl]; Phenylephrine; and Betadine [povidone iodine]  Family History  Problem Relation Age of Onset  . Dementia Mother   . Dementia Sister   . Breast cancer Neg Hx     Social History Social History   Tobacco Use  . Smoking status: Former Research scientist (life sciences)  . Smokeless tobacco: Never Used  Substance Use Topics  . Alcohol use: No  . Drug use: No    Review of Systems  Constitutional: No fever/chills  Cardiovascular: Denies chest pain. Respiratory: Denies shortness of breath. Gastrointestinal: As above  Genitourinary: As above Musculoskeletal: Negative for back pain. Skin: Negative for rash. Neurological: Negative for headaches    ____________________________________________   PHYSICAL EXAM:  VITAL SIGNS: ED Triage Vitals  Enc Vitals Group     BP 01/04/19 1944 128/62     Pulse Rate 01/04/19 1944 75     Resp 01/04/19 1944 14     Temp 01/04/19 1944 98.3 F (36.8 C)     Temp Source 01/04/19 1944 Oral     SpO2 01/04/19 1944 99 %     Weight 01/04/19 1944 56.7 kg (125 lb)     Height 01/04/19 1944 1.575 m (5' 2" )     Baker Circumference --      Peak Flow --      Pain Score 01/04/19 1949 1     Pain Loc --      Pain Edu? --      Excl. in [place]? --     Constitutional: Alert and oriented. No acute  distress.  Eyes: Conjunctivae are normal.   Nose: No congestion/rhinnorhea. Mouth/Throat: Mucous membranes are moist.    Cardiovascular: Normal rate, regular rhythm.   Good peripheral circulation. Respiratory: Normal respiratory effort.  No retractions. Gastrointestinal: Soft and nontender. No distention.   Genitourinary: Prolapsed bladder noted, easily reduced nontender, no bleeding noted Musculoskeletal: No lower extremity tenderness nor edema.  Warm and well perfused Neurologic: No gross focal neurologic deficits are appreciated.  Skin:  Skin is warm, dry and intact. No rash  noted. Psychiatric: Mood and affect are normal. Speech and behavior are normal.  ____________________________________________   LABS (all labs ordered are listed, but only abnormal results are displayed)  Labs Reviewed  URINALYSIS, COMPLETE (UACMP) WITH MICROSCOPIC - Abnormal; Notable for the following components:      Result Value   Color, Urine STRAW (*)    APPearance CLEAR (*)    Hgb urine dipstick LARGE (*)    Leukocytes, UA TRACE (*)    Bacteria, UA RARE (*)    All other components within normal limits   ____________________________________________  EKG  None ____________________________________________  RADIOLOGY  None ____________________________________________   PROCEDURES  Procedure(s) performed: No  Procedures   Critical Care performed: No ____________________________________________   INITIAL IMPRESSION / ASSESSMENT AND PLAN / ED COURSE  Pertinent labs & imaging results that were available during my care of the patient were reviewed by me and considered in my medical decision making (see chart for details).  Patient overall well-appearing and in no acute distress.  Exam is overall reassuring.  Pending urinalysis.  As of leukocytes on urinalysis, suspicious for UTI, will treat with Keflex.  Patient will need close follow-up with Dr. Leafy Ro for further evaluation     ____________________________________________   FINAL CLINICAL IMPRESSION(S) / ED DIAGNOSES  Final diagnoses:  Lower urinary tract infectious disease  Vaginal bleeding        Note:  This document was prepared using Dragon voice recognition software and may include unintentional dictation errors.   Lavonia Drafts, MD 01/04/19 2120

## 2019-01-04 NOTE — ED Triage Notes (Signed)
Pt arrived to the ED accompanied by her daughter for complaints of blood in the urine. Pt reports that she has been told by her primary Dr. Parks Ranger her "uterus is pushing on her bladder and that may cause issues." Pt is AOx4 in no apparent distress.

## 2019-01-11 ENCOUNTER — Other Ambulatory Visit: Payer: Self-pay | Admitting: Neurology

## 2019-01-11 ENCOUNTER — Other Ambulatory Visit (HOSPITAL_COMMUNITY): Payer: Self-pay | Admitting: Neurology

## 2019-01-11 DIAGNOSIS — R413 Other amnesia: Secondary | ICD-10-CM

## 2019-01-29 ENCOUNTER — Ambulatory Visit: Payer: Medicare Other

## 2019-02-18 ENCOUNTER — Ambulatory Visit
Admission: RE | Admit: 2019-02-18 | Discharge: 2019-02-18 | Disposition: A | Discharge status: Home / Self Care | Payer: Medicare Other | Source: Ambulatory Visit | Attending: Internal Medicine | Admitting: Internal Medicine

## 2019-02-18 DIAGNOSIS — Z1231 Encounter for screening mammogram for malignant neoplasm of breast: Secondary | ICD-10-CM | POA: Diagnosis present

## 2019-03-11 IMAGING — MG DIGITAL SCREENING BILATERAL MAMMOGRAM WITH TOMO AND CAD
6 of 12 series · 6 of 36 positions shown · non-contrast
Comparison: Previous exam(s).

CLINICAL DATA: Screening.

EXAM:
DIGITAL SCREENING BILATERAL MAMMOGRAM WITH TOMO AND CAD

[R CC synth-2D (1 of 2)]
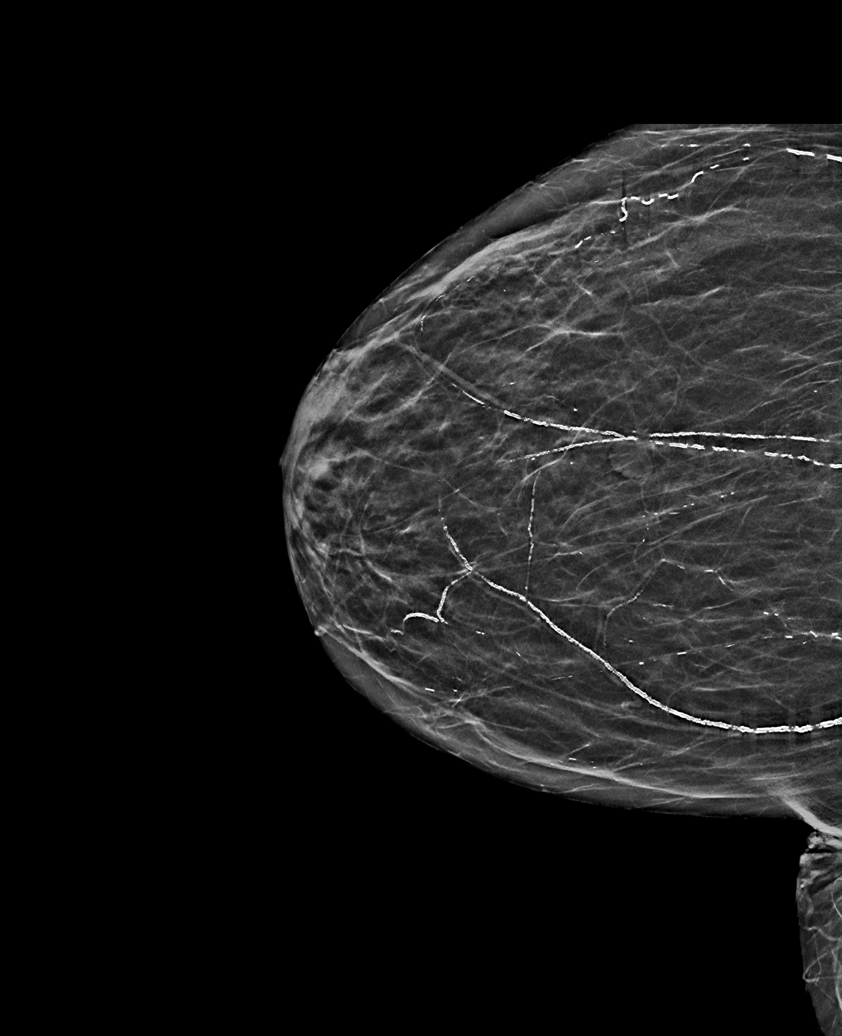

[R CC synth-2D (2 of 2)]
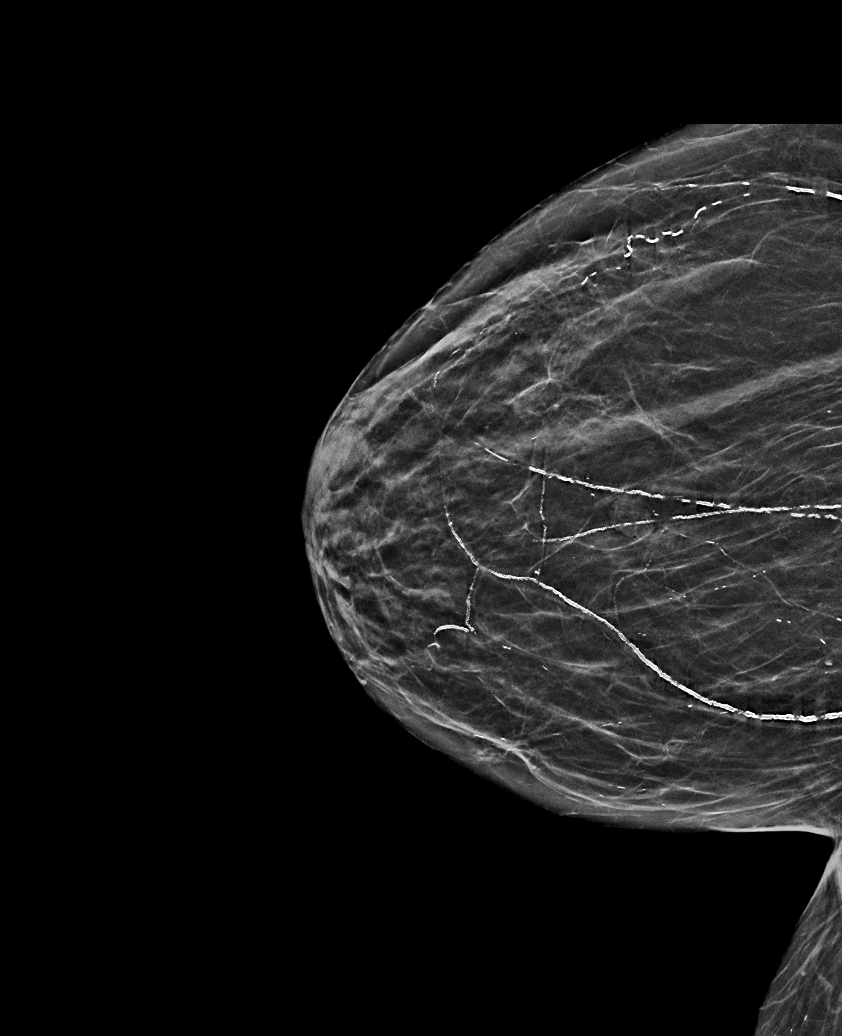

[L CC synth-2D]
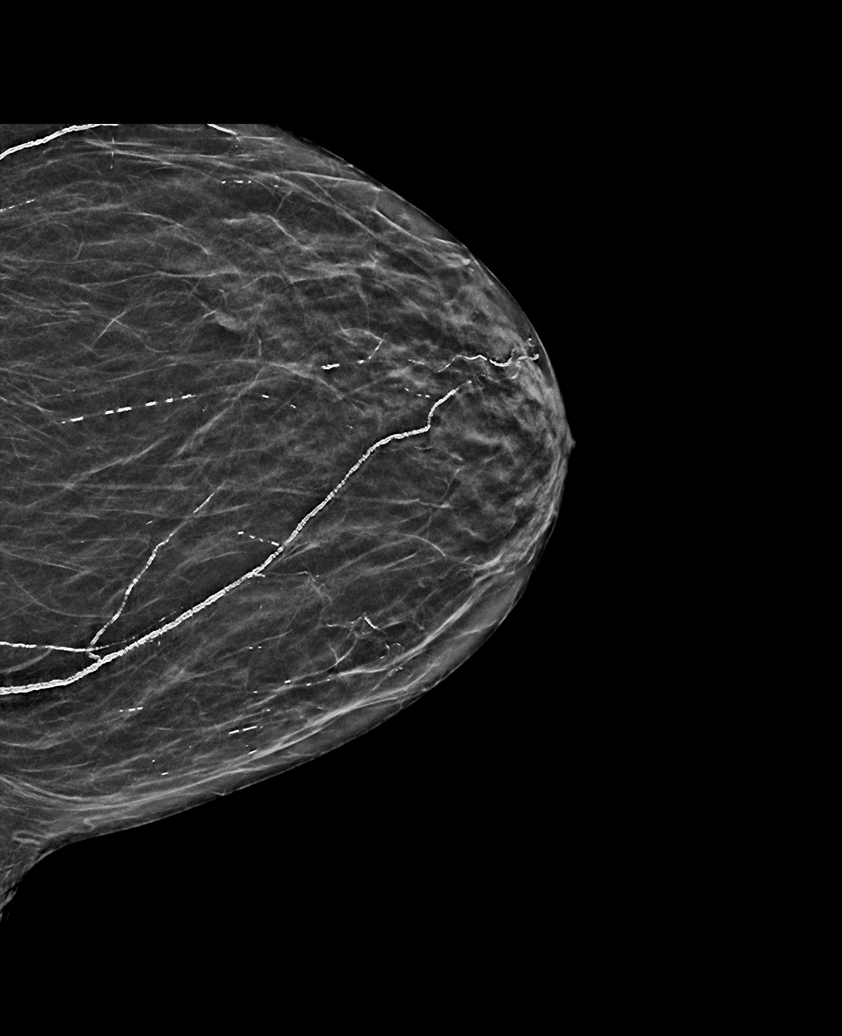

[L MLO synth-2D]
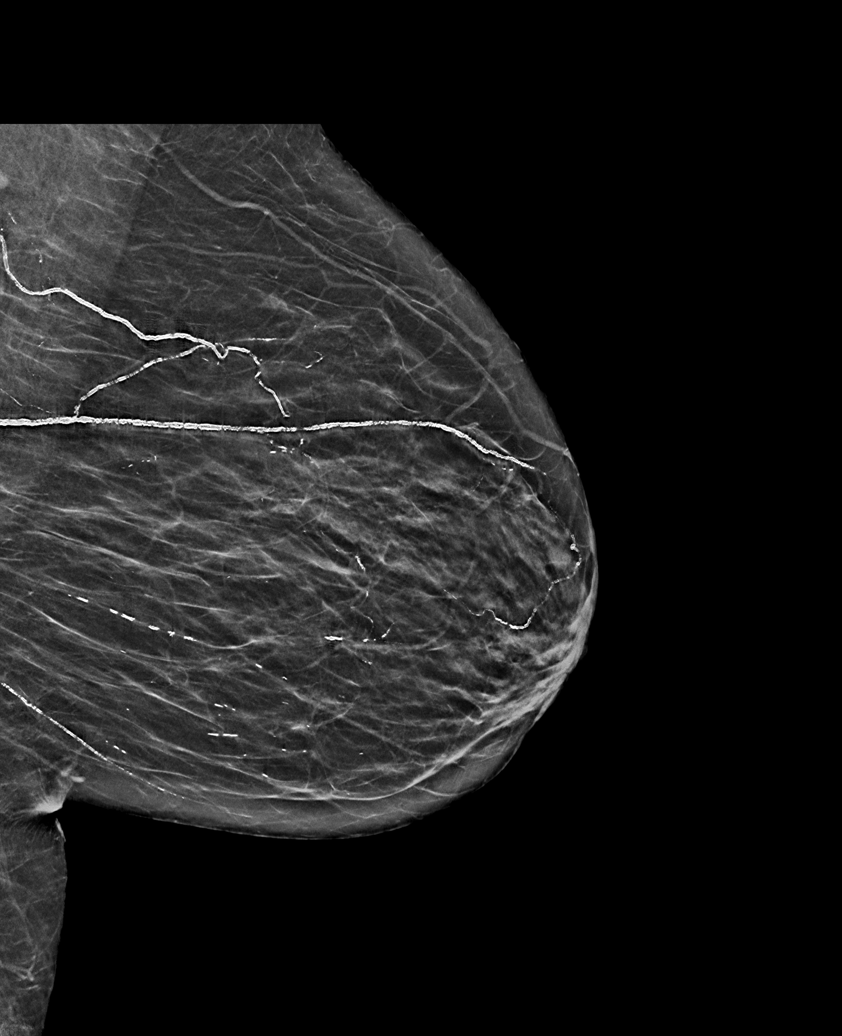

[R MLO synth-2D]
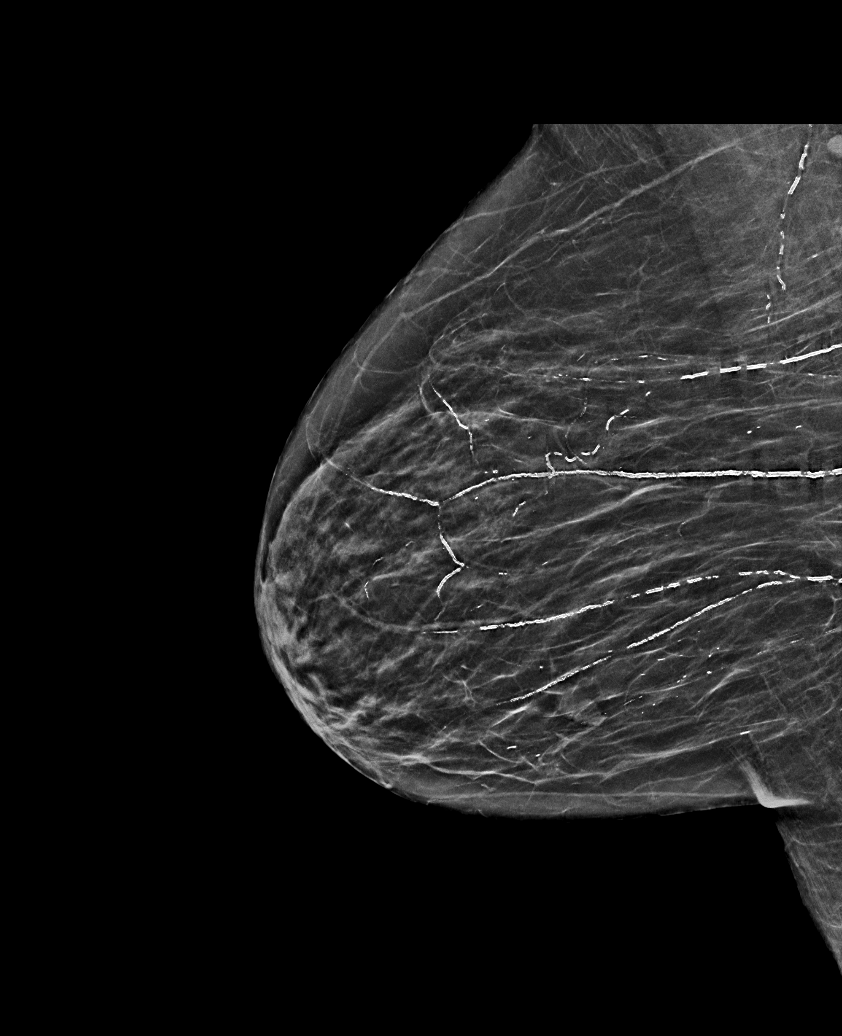

[L XCCL synth-2D]
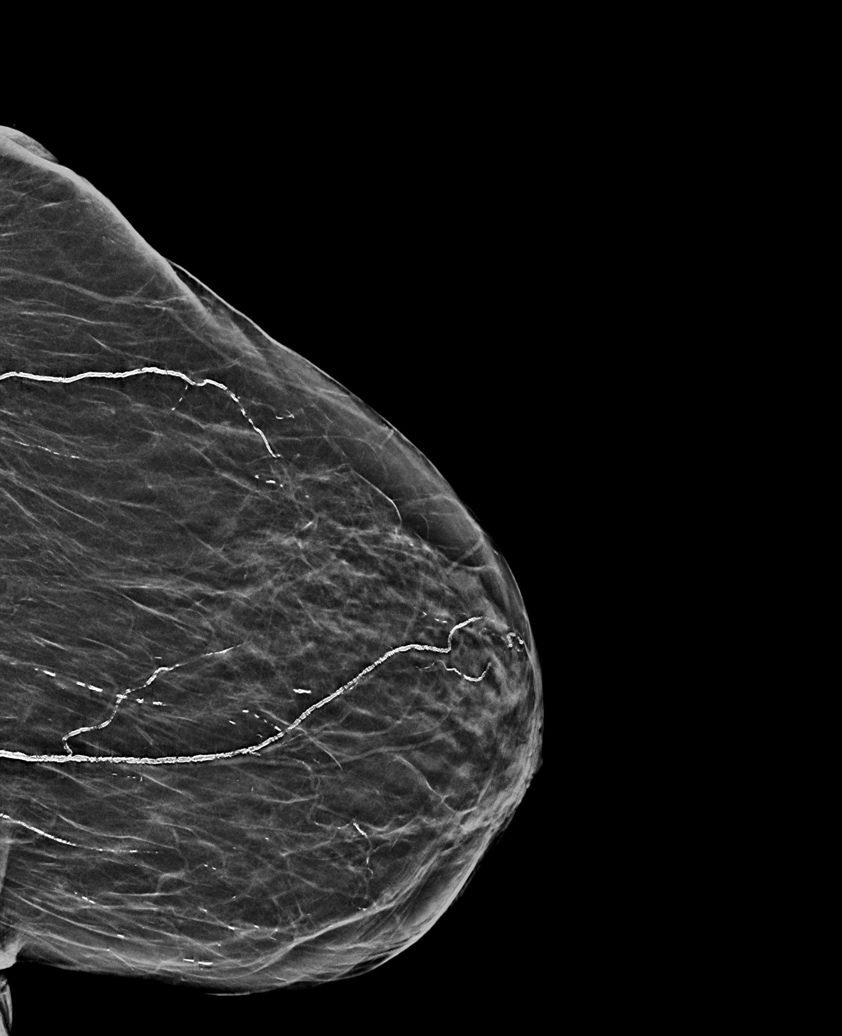

[6 of 36 positions shown; findings below may reference images not displayed]

ACR Breast Density Category b: There are scattered areas of
fibroglandular density.
FINDINGS: There are no findings suspicious for malignancy. Images were
processed with CAD.
IMPRESSION: No mammographic evidence of malignancy. A result letter of this
screening mammogram will be mailed directly to the patient.

RECOMMENDATION:
Screening mammogram in one year. (Code:[id number])

BI-RADS CATEGORY  1: Negative.

## 2019-05-05 ENCOUNTER — Other Ambulatory Visit (INDEPENDENT_AMBULATORY_CARE_PROVIDER_SITE_OTHER): Payer: Self-pay | Admitting: Vascular Surgery

## 2019-05-28 ENCOUNTER — Encounter (INDEPENDENT_AMBULATORY_CARE_PROVIDER_SITE_OTHER): Payer: Medicare Other

## 2019-05-28 ENCOUNTER — Ambulatory Visit (INDEPENDENT_AMBULATORY_CARE_PROVIDER_SITE_OTHER): Payer: Medicare Other | Admitting: Nurse Practitioner

## 2019-07-01 ENCOUNTER — Ambulatory Visit (INDEPENDENT_AMBULATORY_CARE_PROVIDER_SITE_OTHER): Payer: Medicare Other | Admitting: Nurse Practitioner

## 2019-07-01 ENCOUNTER — Encounter (INDEPENDENT_AMBULATORY_CARE_PROVIDER_SITE_OTHER): Payer: Medicare Other

## 2019-07-29 ENCOUNTER — Ambulatory Visit (INDEPENDENT_AMBULATORY_CARE_PROVIDER_SITE_OTHER): Payer: Medicare Other

## 2019-07-29 ENCOUNTER — Other Ambulatory Visit: Payer: Self-pay

## 2019-07-29 ENCOUNTER — Encounter (INDEPENDENT_AMBULATORY_CARE_PROVIDER_SITE_OTHER): Payer: Self-pay | Admitting: Nurse Practitioner

## 2019-07-29 ENCOUNTER — Encounter (INDEPENDENT_AMBULATORY_CARE_PROVIDER_SITE_OTHER): Payer: Self-pay

## 2019-07-29 ENCOUNTER — Ambulatory Visit (INDEPENDENT_AMBULATORY_CARE_PROVIDER_SITE_OTHER): Payer: Medicare Other | Admitting: Nurse Practitioner

## 2019-07-29 VITALS — BP 151/74 | HR 68 | Resp 20 | Ht 62.0 in | Wt 127.0 lb

## 2019-07-29 DIAGNOSIS — K551 Chronic vascular disorders of intestine: Secondary | ICD-10-CM

## 2019-07-29 DIAGNOSIS — I10 Essential (primary) hypertension: Secondary | ICD-10-CM

## 2019-07-29 DIAGNOSIS — E785 Hyperlipidemia, unspecified: Secondary | ICD-10-CM

## 2019-08-05 ENCOUNTER — Encounter (INDEPENDENT_AMBULATORY_CARE_PROVIDER_SITE_OTHER): Payer: Self-pay | Admitting: Nurse Practitioner

## 2019-08-05 NOTE — Progress Notes (Signed)
SUBJECTIVE:  Patient ID: Megan Baker, female    DOB: 03-10-1941, 78 y.o.   MRN: [id number] Chief Complaint  Patient presents with  . Follow-up    HPI  Megan Baker is a 78 y.o. 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  The patient denies abdominal pain or postprandial symptoms.  The patient denies weight loss as well as nausea.  The patient does not substantiate food fear, particular foods do not seem to aggravate or alleviate the symptoms.  The patient denies bloody bowel movements or diarrhea.  The patient has a history of colonoscopy which was not diagnostic.  No history of peptic ulcer disease.   Patient underwent angiogram of the mesenteric arteries on 04/16/2018.  PTA of the SMA was performed.  The patient denies amaurosis fugax or recent TIA symptoms. There are no recent neurological changes noted. The patient denies claudication symptoms or rest pain symptoms. The patient denies history of DVT, PE or superficial thrombophlebitis. The patient denies recent episodes of angina   The patient underwent noninvasive studies today which reveals a aortic diameter of 2.3 cm.  The celiac artery, superior mesenteric artery, inferior mesenteric artery, splenic artery and hepatic arteries are all normal.  There is no evidence of elevated velocities suggestive of ischemia. Past Medical History:  Diagnosis Date  . Allergic rhinitis   . Arthritis   . Autoimmune hepatitis ([place])   . Avascular necrosis of bone of hip, right ([place])   . Burning mouth syndrome   . Celiac sprue   . Connective tissue disease ([place])   . COPD (chronic obstructive pulmonary disease) ([place])   . Coronary artery disease   . Depression   . Gall stones   . Hyperlipidemia   . Hypertension   . Pelvic prolapse   . Psoriasis     Past Surgical History:  Procedure Laterality Date  . AMPUTATION FINGER    .  CORONARY ANGIOPLASTY WITH STENT PLACEMENT    . ESOPHAGOGASTRODUODENOSCOPY (EGD) WITH PROPOFOL N/A 10/08/2016   Procedure: ESOPHAGOGASTRODUODENOSCOPY (EGD) WITH PROPOFOL;  Surgeon: Lollie Sails, MD;  Location: [facility] ENDOSCOPY;  Service: Endoscopy;  Laterality: N/A;  . EYE SURGERY    . LIVER BIOPSY    . MUSCLE BIOPSY    . TUBAL LIGATION    . VISCERAL ANGIOGRAPHY N/A 04/16/2018   Procedure: VISCERAL ANGIOGRAPHY;  Surgeon: Algernon Huxley, MD;  Location: [place] CV LAB;  Service: Cardiovascular;  Laterality: N/A;    Social History   Socioeconomic History  . Marital status: Single    Spouse name: Not on file  . Number of children: Not on file  . Years of education: Not on file  . Highest education level: Not on file  Occupational History  . Not on file  Social Needs  . Financial resource strain: Not on file  . Food insecurity    Worry: Not on file    Inability: Not on file  . Transportation needs    Medical: Not on file    Non-medical: Not on file  Tobacco Use  . Smoking status: Former Research scientist (life sciences)  . Smokeless tobacco: Never Used  Substance and Sexual Activity  . Alcohol use: No  . Drug use: No  . Sexual activity: Not on file  Lifestyle  . Physical activity    Days per week: Not on file    Minutes per session: Not on file  .  Stress: Not on file  Relationships  . Social Herbalist on phone: Not on file    Gets together: Not on file    Attends religious service: Not on file    Active member of club or organization: Not on file    Attends meetings of clubs or organizations: Not on file    Relationship status: Not on file  . Intimate partner violence    Fear of current or ex partner: Not on file    Emotionally abused: Not on file    Physically abused: Not on file    Forced sexual activity: Not on file  Other Topics Concern  . Not on file  Social History Narrative   Lives at home by herself.    Family History  Problem Relation Age of Onset  . Dementia  Mother   . Dementia Sister   . Breast cancer Paternal Aunt     Allergies  Allergen Reactions  . Neo-Synephrine [Phenylephrine Hcl] Anaphylaxis  . Fluorescein Other (See Comments)    intolerance  . Latex   . Phenylephrine   . Betadine [Povidone Iodine] Rash     Review of Systems   Review of Systems: Negative Unless Checked Constitutional: [] Weight loss  [] Fever  [] Chills Cardiac: [] Chest pain   []  Atrial Fibrillation  [] Palpitations   [] Shortness of breath when laying flat   [] Shortness of breath with exertion. [] Shortness of breath at rest Vascular:  [] Pain in legs with walking   [] Pain in legs with standing [] Pain in legs when laying flat   [] Claudication    [] Pain in feet when laying flat    [] History of DVT   [] Phlebitis   [] Swelling in legs   [] Varicose veins   [] Non-healing ulcers Pulmonary:   [] Uses home oxygen   [] Productive cough   [] Hemoptysis   [] Wheeze  [] COPD   [] Asthma Neurologic:  [] Dizziness   [] Seizures  [] Blackouts [] History of stroke   [] History of TIA  [] Aphasia   [] Temporary Blindness   [] Weakness or numbness in arm   [] Weakness or numbness in leg Musculoskeletal:   [] Joint swelling   [] Joint pain   [] Low back pain  []  History of Knee Replacement [] Arthritis [] back Surgeries  []  Spinal Stenosis    Hematologic:  [] Easy bruising  [] Easy bleeding   [] Hypercoagulable state   [] Anemic Gastrointestinal:  [x] Diarrhea   [] Vomiting  [] Gastroesophageal reflux/heartburn   [] Difficulty swallowing. [] Abdominal pain Genitourinary:  [] Chronic kidney disease   [] Difficult urination  [] Anuric   [] Blood in urine [] Frequent urination  [] Burning with urination   [] Hematuria Skin:  [] Rashes   [] Ulcers [] Wounds Psychological:  [] History of anxiety   []  History of major depression  []  Memory Difficulties      OBJECTIVE:   Physical Exam  BP (!) 151/74 (BP Location: Right Arm)   Pulse 68   Resp 20   Ht 5' 2"  (1.575 m)   Wt 127 lb (57.6 kg)   BMI 23.23 kg/m   Gen: WD/WN, NAD  Head: [place]/AT, No temporalis wasting.  Ear/Nose/Throat: Hearing grossly intact, nares w/o erythema or drainage Eyes: PER, EOMI, sclera nonicteric.  Neck: Supple, no masses.  No JVD.  Pulmonary:  Good air movement, no use of accessory muscles.  Cardiac: RRR Vascular:  Vessel Right Left  Radial Palpable Palpable  Dorsalis Pedis Palpable Palpable  Posterior Tibial Palpable Palpable   Gastrointestinal: soft, non-distended. No guarding/no peritoneal signs.  Musculoskeletal: M/S 5/5 throughout.  No deformity or atrophy.  Neurologic: Pain  and light touch intact in extremities.  Symmetrical.  Speech is fluent. Motor exam as listed above. Psychiatric: Judgment intact, Mood & affect appropriate for pt's clinical situation. Dermatologic: No Venous rashes. No Ulcers Noted.  No changes consistent with cellulitis. Lymph : No Cervical lymphadenopathy, no lichenification or skin changes of chronic lymphedema.       ASSESSMENT AND PLAN:  1. Chronic mesenteric ischemia (HCC) Recommend:  The patient has evidence of chronic asymptomatic mesenteric atherosclerosis.  The patient denies lifestyle limiting changes at this point in time.  Given the lack of symptoms no intervention is warranted at this time.   No further invasive studies, angiography or surgery at this time  The patient should continue walking and begin a more formal exercise program.   The patient should continue antiplatelet therapy and aggressive treatment of the lipid abnormalities  Patient should undergo noninvasive studies as ordered. The patient will follow up with me after the studies.   Patient will follow-up in 1 year  - VAS [place] MESENTERIC; Future  2. Hyperlipidemia, unspecified hyperlipidemia type Continue statin as ordered and reviewed, no changes at this time   3. Essential hypertension Continue antihypertensive medications as already ordered, these medications have been reviewed and there are no changes at this time.     Current Outpatient Medications on File Prior to Visit  Medication Sig Dispense Refill  . acetaminophen (TYLENOL) 325 MG tablet Take 2 tablets (650 mg total) by mouth every 6 (six) hours as needed for mild pain (or Fever >/= 101).    Marland Kitchen albuterol (PROVENTIL HFA;VENTOLIN HFA) 108 (90 Base) MCG/ACT inhaler Inhale into the lungs every 6 (six) hours as needed for wheezing or shortness of breath.    Marland Kitchen alendronate (FOSAMAX) 70 MG tablet Take 70 mg by mouth once a week. Take with a full glass of water on an empty stomach.    Marland Kitchen aspirin EC 81 MG tablet Take 1 tablet (81 mg total) by mouth daily. 150 tablet 2  . calcium carbonate (OSCAL) 1500 (600 Ca) MG TABS tablet Take 1,500 mg by mouth 2 (two) times daily with a meal.    . clopidogrel (PLAVIX) 75 MG tablet TAKE 1 TABLET BY MOUTH EVERY DAY 90 tablet 3  . donepezil (ARICEPT) 5 MG tablet     . fexofenadine (ALLEGRA) 180 MG tablet Take by mouth.    . fluticasone (FLOVENT HFA) 110 MCG/ACT inhaler Inhale 1 puff into the lungs 2 (two) times daily.    . Fluticasone Propionate, Inhal, (FLOVENT DISKUS) 100 MCG/BLIST AEPB Inhale into the lungs.    . Lidocaine HCl 4 % CREA Pain Relief (lidocaine) 4 % topical cream  APPLY 1 APPLICATION TOPICALLY 2 (TWO) TIMES DAILY AS NEEDED    . metoprolol succinate (TOPROL-XL) 100 MG 24 hr tablet Take 100 mg by mouth daily. Take with or immediately following a meal.    . multivitamin-iron-minerals-folic acid (CENTRUM) chewable tablet Chew 1 tablet by mouth daily.    Marland Kitchen olmesartan (BENICAR) 40 MG tablet Take 40 mg by mouth daily.    Marland Kitchen omeprazole (PRILOSEC) 20 MG capsule Take 20 mg by mouth 2 (two) times daily before a meal.    . oxyCODONE (OXY IR/ROXICODONE) 5 MG immediate release tablet Take 1 tablet (5 mg total) by mouth every 4 (four) hours as needed for moderate pain. 30 tablet 0  . pneumococcal 13-valent conjugate vaccine (PREVNAR 13) SUSP injection Prevnar 13 (PF) 0.5 mL intramuscular syringe  ADM 0.5ML IM UTD    .  QUEtiapine (SEROQUEL) 25 MG tablet Take 25 mg by mouth at bedtime.    . simvastatin (ZOCOR) 20 MG tablet Take 20 mg by mouth daily.    . traZODone (DESYREL) 50 MG tablet Take 50 mg by mouth at bedtime.    . triamterene-hydrochlorothiazide (DYAZIDE) 37.5-25 MG capsule Take 1 capsule by mouth daily.    . vitamin B-12 (CYANOCOBALAMIN) 1000 MCG tablet Take 1,000 mcg by mouth daily.    Marland Kitchen azaTHIOprine (IMURAN) 50 MG tablet Take 50 mg by mouth daily.    Marland Kitchen buPROPion (WELLBUTRIN XL) 300 MG 24 hr tablet Take 300 mg by mouth daily.    . cephALEXin (KEFLEX) 500 MG capsule Take 1 capsule (500 mg total) by mouth 2 (two) times daily. (Patient not taking: Reported on 07/29/2019) 14 capsule 0  . nystatin cream (MYCOSTATIN)      No current facility-administered medications on file prior to visit.     There are no Patient Instructions on file for this visit. Return in about 1 year (around 07/28/2020) for Mesenteric stenosis.   Kris Hartmann, NP  This note was completed with Sales executive.  Any errors are purely unintentional.

## 2019-11-14 ENCOUNTER — Emergency Department: Payer: Medicare Other

## 2019-11-14 ENCOUNTER — Encounter: Payer: Self-pay | Admitting: Emergency Medicine

## 2019-11-14 ENCOUNTER — Emergency Department
Admission: EM | Admit: 2019-11-14 | Discharge: 2019-11-14 | Disposition: A | Discharge status: Home / Self Care | Payer: Medicare Other | Attending: Emergency Medicine | Admitting: Emergency Medicine

## 2019-11-14 DIAGNOSIS — I10 Essential (primary) hypertension: Secondary | ICD-10-CM | POA: Insufficient documentation

## 2019-11-14 DIAGNOSIS — J449 Chronic obstructive pulmonary disease, unspecified: Secondary | ICD-10-CM | POA: Insufficient documentation

## 2019-11-14 DIAGNOSIS — Z87891 Personal history of nicotine dependence: Secondary | ICD-10-CM | POA: Diagnosis not present

## 2019-11-14 DIAGNOSIS — Z91040 Latex allergy status: Secondary | ICD-10-CM | POA: Insufficient documentation

## 2019-11-14 DIAGNOSIS — Z7982 Long term (current) use of aspirin: Secondary | ICD-10-CM | POA: Insufficient documentation

## 2019-11-14 DIAGNOSIS — I2510 Atherosclerotic heart disease of native coronary artery without angina pectoris: Secondary | ICD-10-CM | POA: Diagnosis not present

## 2019-11-14 DIAGNOSIS — Z79899 Other long term (current) drug therapy: Secondary | ICD-10-CM | POA: Insufficient documentation

## 2019-11-14 DIAGNOSIS — R4182 Altered mental status, unspecified: Secondary | ICD-10-CM | POA: Diagnosis present

## 2019-11-14 LAB — URINALYSIS, COMPLETE (UACMP) WITH MICROSCOPIC
Bilirubin Urine: NEGATIVE
Glucose, UA: NEGATIVE mg/dL
Hgb urine dipstick: NEGATIVE
Ketones, ur: NEGATIVE mg/dL
Leukocytes,Ua: NEGATIVE
Nitrite: NEGATIVE
Protein, ur: NEGATIVE mg/dL
Specific Gravity, Urine: 1.005 (ref 1.005–1.030)
pH: 6 (ref 5.0–8.0)

## 2019-11-14 LAB — COMPREHENSIVE METABOLIC PANEL
ALT: 22 U/L (ref 0–44)
AST: 38 U/L (ref 15–41)
Albumin: 3.8 g/dL (ref 3.5–5.0)
Alkaline Phosphatase: 31 U/L — ABNORMAL LOW (ref 38–126)
Anion gap: 10 (ref 5–15)
BUN: 7 mg/dL — ABNORMAL LOW (ref 8–23)
CO2: 26 mmol/L (ref 22–32)
Calcium: 9.3 mg/dL (ref 8.9–10.3)
Chloride: 103 mmol/L (ref 98–111)
Creatinine, Ser: 0.91 mg/dL (ref 0.44–1.00)
GFR calc Af Amer: >60 mL/min (ref >60)
GFR calc non Af Amer: >60 mL/min (ref >60)
Glucose, Bld: 85 mg/dL (ref 70–99)
Potassium: 3.8 mmol/L (ref 3.5–5.1)
Sodium: 139 mmol/L (ref 135–145)
Total Bilirubin: 0.4 mg/dL (ref 0.3–1.2)
Total Protein: 7.7 g/dL (ref 6.5–8.1)

## 2019-11-14 LAB — LACTIC ACID, PLASMA
Lactic Acid, Venous: 2.3 mmol/L (ref 0.5–1.9)
Lactic Acid, Venous: 2.2 mmol/L (ref 0.5–1.9)

## 2019-11-14 LAB — CBC WITH DIFFERENTIAL/PLATELET
Abs Immature Granulocytes: 0.01 10*3/uL (ref 0.00–0.07)
Basophils Absolute: 0.1 10*3/uL (ref 0.0–0.1)
Basophils Relative: 1 %
Eosinophils Absolute: 0.4 10*3/uL (ref 0.0–0.5)
Eosinophils Relative: 7 %
HCT: 40.8 % (ref 36.0–46.0)
Hemoglobin: 13.4 g/dL (ref 12.0–15.0)
Immature Granulocytes: 0 %
Lymphocytes Relative: 36 %
Lymphs Abs: 2 10*3/uL (ref 0.7–4.0)
MCH: 32 pg (ref 26.0–34.0)
MCHC: 32.8 g/dL (ref 30.0–36.0)
MCV: 97.4 fL (ref 80.0–100.0)
Monocytes Absolute: 0.9 10*3/uL (ref 0.1–1.0)
Monocytes Relative: 15 %
Neutro Abs: 2.3 10*3/uL (ref 1.7–7.7)
Neutrophils Relative %: 41 %
Platelets: 359 10*3/uL (ref 150–400)
RBC: 4.19 MIL/uL (ref 3.87–5.11)
RDW: 11.7 % (ref 11.5–15.5)
WBC: 5.6 10*3/uL (ref 4.0–10.5)
nRBC: 0 % (ref 0.0–0.2)

## 2019-11-14 LAB — TROPONIN I (HIGH SENSITIVITY)
Troponin I (High Sensitivity): 5 ng/L (ref <18)
Troponin I (High Sensitivity): 5 ng/L (ref <18)

## 2019-11-14 MED ORDER — SODIUM CHLORIDE 0.9 % IV BOLUS
1000.0000 mL | Freq: Once | INTRAVENOUS | Status: AC
  Administered 2019-11-14: 1000 mL via INTRAVENOUS

## 2019-11-14 NOTE — ED Triage Notes (Signed)
Pt arrived via EMS from home where family called out due to pt altered from her baseline. Pt negative for facial droop, drift, with moderate grip strength. Pt has hx/o dementia. Pt has non-productive dry cough on assessment. Pt A&O to self, place, situation, not time.

## 2019-11-14 NOTE — Discharge Instructions (Signed)
Thank you for letting us take care of you in the emergency department today.   Please continue to take any regular, prescribed medications.  Please be sure to stay well-hydrated and maintain a healthy diet.  Please follow up with: - Your primary care doctor to review your ER visit and follow up on your symptoms.   Please return to the ER for any new or worsening symptoms, including any signs or symptoms of infection, as we discussed, such as vomiting/diarrhea, cough or difficulty breathing, urinary symptoms.

## 2019-11-14 NOTE — ED Provider Notes (Signed)
Repeat lactic acid after fluids decreased slightly to 2.2 from 2.3.  Patient continues to feel well and is at her baseline per daughter.  She has no focal infectious symptoms.  I discussed the nonspecific nature of this lab with the daughter, with regards to keeping a close eye on any development of potential infectious type symptoms as well as maintaining adequate hydration.  At this point in time, daughter feels comfortable bringing the patient home.  As such, will proceed with discharge.  Discussed strict return precautions.   Lilia Pro., MD 11/14/19 [phone]

## 2019-11-14 NOTE — ED Provider Notes (Signed)
[facility] Emergency Department Provider Note ____________________________________________   First MD Initiated Contact with Patient 11/14/19 1324     (approximate)  I have reviewed the triage vital signs and the nursing notes.   HISTORY  Chief Complaint Altered Mental Status  Level 5 caveat: History of present illness limited due to dementia  HPI Megan Baker is a 78 y.o. female with PMH as noted below who presents with altered mental status.  The patient is unable to give any history, but she denies any pain.  Past Medical History:  Diagnosis Date  . Allergic rhinitis   . Arthritis   . Autoimmune hepatitis ([place])   . Avascular necrosis of bone of hip, right ([place])   . Burning mouth syndrome   . Celiac sprue   . Connective tissue disease ([place])   . COPD (chronic obstructive pulmonary disease) ([place])   . Coronary artery disease   . Depression   . Gall stones   . Hyperlipidemia   . Hypertension   . Pelvic prolapse   . Psoriasis     Patient Active Problem List   Diagnosis Date Noted  . Pelvic fracture ([place]) 09/27/2018  . Hyperlipidemia 03/10/2018  . Hypertension 03/10/2018  . Chronic mesenteric ischemia ([place]) 03/10/2018    Past Surgical History:  Procedure Laterality Date  . AMPUTATION FINGER    . CORONARY ANGIOPLASTY WITH STENT PLACEMENT    . ESOPHAGOGASTRODUODENOSCOPY (EGD) WITH PROPOFOL N/A 10/08/2016   Procedure: ESOPHAGOGASTRODUODENOSCOPY (EGD) WITH PROPOFOL;  Surgeon: Lollie Sails, MD;  Location: [facility] ENDOSCOPY;  Service: Endoscopy;  Laterality: N/A;  . EYE SURGERY    . LIVER BIOPSY    . MUSCLE BIOPSY    . TUBAL LIGATION    . VISCERAL ANGIOGRAPHY N/A 04/16/2018   Procedure: VISCERAL ANGIOGRAPHY;  Surgeon: Algernon Huxley, MD;  Location: [place] CV LAB;  Service: Cardiovascular;  Laterality: N/A;    Prior to Admission medications   Medication Sig Start Date End Date Taking? Authorizing Provider  acetaminophen (TYLENOL)  325 MG tablet Take 2 tablets (650 mg total) by mouth every 6 (six) hours as needed for mild pain (or Fever >/= 101). 09/29/18   Dustin Flock, MD  albuterol (PROVENTIL HFA;VENTOLIN HFA) 108 (90 Base) MCG/ACT inhaler Inhale into the lungs every 6 (six) hours as needed for wheezing or shortness of breath.    [provider]  alendronate (FOSAMAX) 70 MG tablet Take 70 mg by mouth once a week. Take with a full glass of water on an empty stomach.    [provider]  aspirin EC 81 MG tablet Take 1 tablet (81 mg total) by mouth daily. 04/16/18   Algernon Huxley, MD  azaTHIOprine (IMURAN) 50 MG tablet Take 50 mg by mouth daily.    [provider]  buPROPion (WELLBUTRIN XL) 300 MG 24 hr tablet Take 300 mg by mouth daily.    [provider]  calcium carbonate (OSCAL) 1500 (600 Ca) MG TABS tablet Take 1,500 mg by mouth 2 (two) times daily with a meal.    [provider]  clopidogrel (PLAVIX) 75 MG tablet TAKE 1 TABLET BY MOUTH EVERY DAY 05/06/19   Algernon Huxley, MD  donepezil (ARICEPT) 5 MG tablet  03/29/19   [provider]  fexofenadine (ALLEGRA) 180 MG tablet Take by mouth. 07/13/19 07/12/20  [provider]  fluticasone (FLOVENT HFA) 110 MCG/ACT inhaler Inhale 1 puff into the lungs 2 (two) times daily.    [provider]  Fluticasone Propionate, Inhal, (FLOVENT DISKUS) 100 MCG/BLIST AEPB Inhale into the lungs.    [provider]  Lidocaine HCl 4 % CREA Pain Relief (lidocaine) 4 % topical cream  APPLY 1 APPLICATION TOPICALLY 2 (TWO) TIMES DAILY AS NEEDED    [provider]  metoprolol succinate (TOPROL-XL) 100 MG 24 hr tablet Take 100 mg by mouth daily. Take with or immediately following a meal.    [provider]  multivitamin-iron-minerals-folic acid (CENTRUM) chewable tablet Chew 1 tablet by mouth daily.    [provider]  nystatin cream (MYCOSTATIN)  07/21/15   [provider]  olmesartan  (BENICAR) 40 MG tablet Take 40 mg by mouth daily.    [provider]  omeprazole (PRILOSEC) 20 MG capsule Take 20 mg by mouth 2 (two) times daily before a meal.    [provider]  pneumococcal 13-valent conjugate vaccine (PREVNAR 13) SUSP injection Prevnar 13 (PF) 0.5 mL intramuscular syringe  ADM 0.5ML IM UTD    [provider]  QUEtiapine (SEROQUEL) 25 MG tablet Take 25 mg by mouth at bedtime.    [provider]  simvastatin (ZOCOR) 20 MG tablet Take 20 mg by mouth daily.    [provider]  traZODone (DESYREL) 50 MG tablet Take 50 mg by mouth at bedtime.    [provider]  triamterene-hydrochlorothiazide (DYAZIDE) 37.5-25 MG capsule Take 1 capsule by mouth daily.    [provider]  vitamin B-12 (CYANOCOBALAMIN) 1000 MCG tablet Take 1,000 mcg by mouth daily.    [provider]    Allergies Neo-synephrine [phenylephrine hcl], Fluorescein, Latex, Phenylephrine, and Betadine [povidone iodine]  Family History  Problem Relation Age of Onset  . Dementia Mother   . Dementia Sister   . Breast cancer Paternal Aunt     Social History Social History   Tobacco Use  . Smoking status: Former Research scientist (life sciences)  . Smokeless tobacco: Never Used  Substance Use Topics  . Alcohol use: No  . Drug use: No    Review of Systems Level 5 caveat: Unable to obtain review of systems due to dementia    ____________________________________________   PHYSICAL EXAM:  VITAL SIGNS: ED Triage Vitals [11/14/19 1309]  Enc Vitals Group     BP (!) 168/76     Pulse Rate 72     Resp 14     Temp 99.2 F (37.3 C)     Temp Source Oral     SpO2 100 %     Weight      Height      Head Circumference      Peak Flow      Pain Score      Pain Loc      Pain Edu?      Excl. in [place]?     Constitutional: Alert, oriented x2.  Comfortable appearing. Eyes: Conjunctivae are normal.  Head: Atraumatic. Nose: No congestion/rhinnorhea. Mouth/Throat:  Mucous membranes are moist.   Neck: Normal range of motion.  Cardiovascular: Normal rate, regular rhythm. Grossly normal heart sounds.  Good peripheral circulation. Respiratory: Normal respiratory effort.  No retractions. Lungs CTAB. Gastrointestinal: Soft and nontender. No distention.  Genitourinary: No flank tenderness. Musculoskeletal: No lower extremity edema.  Extremities warm and well perfused.  Neurologic:  Normal speech and language. No gross focal neurologic deficits are appreciated.  Skin:  Skin is warm and dry. No rash noted. Psychiatric: Calm and cooperative.  ____________________________________________   LABS (all labs ordered are  listed, but only abnormal results are displayed)  Labs Reviewed  LACTIC ACID, PLASMA - Abnormal; Notable for the following components:      Result Value   Lactic Acid, Venous 2.3 (*)    All other components within normal limits  COMPREHENSIVE METABOLIC PANEL - Abnormal; Notable for the following components:   BUN 7 (*)    Alkaline Phosphatase 31 (*)    All other components within normal limits  URINALYSIS, COMPLETE (UACMP) WITH MICROSCOPIC - Abnormal; Notable for the following components:   Color, Urine YELLOW (*)    APPearance CLEAR (*)    Bacteria, UA RARE (*)    All other components within normal limits  CBC WITH DIFFERENTIAL/PLATELET  LACTIC ACID, PLASMA  TROPONIN I (HIGH SENSITIVITY)   ____________________________________________  EKG   ____________________________________________  RADIOLOGY  CXR: Left lung base opacity likely scarring or atelectasis  ____________________________________________   PROCEDURES  Procedure(s) performed: No  Procedures  Critical Care performed: No ____________________________________________   INITIAL IMPRESSION / ASSESSMENT AND PLAN / ED COURSE  Pertinent labs & imaging results that were available during my care of the patient were reviewed by me and considered in my medical  decision making (see chart for details).  78 year old female presents from home with altered mental status.  The patient has a history of dementia so her exact baseline mental status is unclear.  She is unable to give any significant history, but denies acute complaints.  On exam, the patient is alert and well-appearing.  Her vital signs are normal except for hypertension.  Neurologic exam is nonfocal.  There is no evidence of trauma.  The remainder of the exam is relatively unremarkable.  Differential is broad but includes UTI or other infection, dehydration, electrolyte abnormality, other metabolic cause, or less likely CNS or cardiac etiology.  We will obtain CT head, lab work-up, and reassess.  ----------------------------------------- 3:22 PM on 11/14/2019 -----------------------------------------  The daughter is now here and was able to give some additional history.  She states that the family noted that the patient seemed to be slurring her speech this morning, although this has now resolved.  She states that the patient is currently at her baseline.  CT head is negative for acute findings.  Lab work-up is notable for slightly elevated lactate which is nonspecific.  Patient overall has no signs of sepsis or active infection.  We will give a fluid bolus and reassess.  I anticipate discharge home if the lactate clears and the patient remains asymptomatic.  I have signed the patient out to the oncoming physician Dr. Joan Mayans.  ____________________________________________   FINAL CLINICAL IMPRESSION(S) / ED DIAGNOSES  Final diagnoses:  Altered mental status, unspecified altered mental status type      NEW MEDICATIONS STARTED DURING THIS VISIT:  New Prescriptions   No medications on file     Note:  This document was prepared using Dragon voice recognition software and may include unintentional dictation errors.    Arta Silence, MD 11/14/19 [phone]

## 2019-11-17 ENCOUNTER — Emergency Department: Payer: Medicare Other

## 2019-11-17 ENCOUNTER — Emergency Department
Admission: EM | Admit: 2019-11-17 | Discharge: 2019-11-17 | Disposition: A | Discharge status: Home / Self Care | Payer: Medicare Other | Attending: Emergency Medicine | Admitting: Emergency Medicine

## 2019-11-17 ENCOUNTER — Other Ambulatory Visit: Payer: Self-pay

## 2019-11-17 ENCOUNTER — Encounter: Payer: Self-pay | Admitting: Emergency Medicine

## 2019-11-17 DIAGNOSIS — S01111A Laceration without foreign body of right eyelid and periocular area, initial encounter: Secondary | ICD-10-CM | POA: Insufficient documentation

## 2019-11-17 DIAGNOSIS — Y929 Unspecified place or not applicable: Secondary | ICD-10-CM | POA: Insufficient documentation

## 2019-11-17 DIAGNOSIS — J449 Chronic obstructive pulmonary disease, unspecified: Secondary | ICD-10-CM | POA: Diagnosis not present

## 2019-11-17 DIAGNOSIS — Z87891 Personal history of nicotine dependence: Secondary | ICD-10-CM | POA: Diagnosis not present

## 2019-11-17 DIAGNOSIS — Y9389 Activity, other specified: Secondary | ICD-10-CM | POA: Diagnosis not present

## 2019-11-17 DIAGNOSIS — I2510 Atherosclerotic heart disease of native coronary artery without angina pectoris: Secondary | ICD-10-CM | POA: Insufficient documentation

## 2019-11-17 DIAGNOSIS — W1839XA Other fall on same level, initial encounter: Secondary | ICD-10-CM | POA: Insufficient documentation

## 2019-11-17 DIAGNOSIS — Z91040 Latex allergy status: Secondary | ICD-10-CM | POA: Insufficient documentation

## 2019-11-17 DIAGNOSIS — Y999 Unspecified external cause status: Secondary | ICD-10-CM | POA: Insufficient documentation

## 2019-11-17 DIAGNOSIS — Z79899 Other long term (current) drug therapy: Secondary | ICD-10-CM | POA: Insufficient documentation

## 2019-11-17 DIAGNOSIS — S0181XA Laceration without foreign body of other part of head, initial encounter: Secondary | ICD-10-CM

## 2019-11-17 DIAGNOSIS — Z7901 Long term (current) use of anticoagulants: Secondary | ICD-10-CM | POA: Diagnosis not present

## 2019-11-17 DIAGNOSIS — I10 Essential (primary) hypertension: Secondary | ICD-10-CM | POA: Insufficient documentation

## 2019-11-17 DIAGNOSIS — S0083XA Contusion of other part of head, initial encounter: Secondary | ICD-10-CM

## 2019-11-17 MED ORDER — LIDOCAINE HCL (PF) 1 % IJ SOLN
INTRAMUSCULAR | Status: AC
  Administered 2019-11-17: 5 mL
  Filled 2019-11-17: qty 5

## 2019-11-17 MED ORDER — LIDOCAINE-EPINEPHRINE-TETRACAINE (LET) TOPICAL GEL
3.0000 mL | Freq: Once | TOPICAL | Status: AC
  Administered 2019-11-17: 3 mL via TOPICAL

## 2019-11-17 MED ORDER — LIDOCAINE HCL (PF) 1 % IJ SOLN
5.0000 mL | Freq: Once | INTRAMUSCULAR | Status: AC
  Administered 2019-11-17: 10:00:00 5 mL

## 2019-11-17 NOTE — ED Provider Notes (Signed)
[facility] Emergency Department Provider Note   ____________________________________________   First MD Initiated Contact with Patient 11/17/19 0802     (approximate)  I have reviewed the triage vital signs and the nursing notes.   HISTORY  Chief Complaint Fall and Laceration    HPI Megan Baker is a 78 y.o. female patient presents for facial contusion and laceration above the right eyebrow secondary to a trip and fall.  Patient states she tripped while trying to pick up a newspaper.  Patient denies LOC.  Patient denies vertigo or vision disturbance.  Bleeding is intermittently controlled with direct pressure.  Patient takes Plavix daily.  Patient rates the pain as a 5/10.  Patient described pain as "aching".         Past Medical History:  Diagnosis Date  . Allergic rhinitis   . Arthritis   . Autoimmune hepatitis ([place])   . Avascular necrosis of bone of hip, right ([place])   . Burning mouth syndrome   . Celiac sprue   . Connective tissue disease ([place])   . COPD (chronic obstructive pulmonary disease) ([place])   . Coronary artery disease   . Depression   . Gall stones   . Hyperlipidemia   . Hypertension   . Pelvic prolapse   . Psoriasis     Patient Active Problem List   Diagnosis Date Noted  . Pelvic fracture ([place]) 09/27/2018  . Hyperlipidemia 03/10/2018  . Hypertension 03/10/2018  . Chronic mesenteric ischemia ([place]) 03/10/2018    Past Surgical History:  Procedure Laterality Date  . AMPUTATION FINGER    . CORONARY ANGIOPLASTY WITH STENT PLACEMENT    . ESOPHAGOGASTRODUODENOSCOPY (EGD) WITH PROPOFOL N/A 10/08/2016   Procedure: ESOPHAGOGASTRODUODENOSCOPY (EGD) WITH PROPOFOL;  Surgeon: Lollie Sails, MD;  Location: [facility] ENDOSCOPY;  Service: Endoscopy;  Laterality: N/A;  . EYE SURGERY    . LIVER BIOPSY    . MUSCLE BIOPSY    . TUBAL LIGATION    . VISCERAL ANGIOGRAPHY N/A 04/16/2018   Procedure: VISCERAL ANGIOGRAPHY;  Surgeon: Algernon Huxley, MD;  Location: [place] CV LAB;  Service: Cardiovascular;  Laterality: N/A;    Prior to Admission medications   Medication Sig Start Date End Date Taking? Authorizing Provider  acetaminophen (TYLENOL) 325 MG tablet Take 2 tablets (650 mg total) by mouth every 6 (six) hours as needed for mild pain (or Fever >/= 101). 09/29/18   Dustin Flock, MD  albuterol (PROVENTIL HFA;VENTOLIN HFA) 108 (90 Base) MCG/ACT inhaler Inhale into the lungs every 6 (six) hours as needed for wheezing or shortness of breath.    [provider]  alendronate (FOSAMAX) 70 MG tablet Take 70 mg by mouth once a week. Take with a full glass of water on an empty stomach.    [provider]  aspirin EC 81 MG tablet Take 1 tablet (81 mg total) by mouth daily. 04/16/18   Algernon Huxley, MD  azaTHIOprine (IMURAN) 50 MG tablet Take 50 mg by mouth daily.    [provider]  buPROPion (WELLBUTRIN XL) 300 MG 24 hr tablet Take 300 mg by mouth daily.    [provider]  calcium carbonate (OSCAL) 1500 (600 Ca) MG TABS tablet Take 1,500 mg by mouth 2 (two) times daily with a meal.    [provider]  clopidogrel (PLAVIX) 75 MG tablet TAKE 1 TABLET BY MOUTH EVERY DAY 05/06/19   Algernon Huxley, MD  donepezil (ARICEPT) 5 MG tablet  03/29/19  [provider]  fexofenadine (ALLEGRA) 180 MG tablet Take by mouth. 07/13/19 07/12/20  [provider]  fluticasone (FLOVENT HFA) 110 MCG/ACT inhaler Inhale 1 puff into the lungs 2 (two) times daily.    [provider]  Fluticasone Propionate, Inhal, (FLOVENT DISKUS) 100 MCG/BLIST AEPB Inhale into the lungs.    [provider]  Lidocaine HCl 4 % CREA Pain Relief (lidocaine) 4 % topical cream  APPLY 1 APPLICATION TOPICALLY 2 (TWO) TIMES DAILY AS NEEDED    [provider]  metoprolol succinate (TOPROL-XL) 100 MG 24 hr tablet Take 100 mg by mouth daily. Take with or immediately following a meal.    [provider]  multivitamin-iron-minerals-folic acid (CENTRUM) chewable tablet Chew 1 tablet by mouth daily.    [provider]  nystatin cream (MYCOSTATIN)  07/21/15   [provider]  olmesartan (BENICAR) 40 MG tablet Take 40 mg by mouth daily.    [provider]  omeprazole (PRILOSEC) 20 MG capsule Take 20 mg by mouth 2 (two) times daily before a meal.    [provider]  pneumococcal 13-valent conjugate vaccine (PREVNAR 13) SUSP injection Prevnar 13 (PF) 0.5 mL intramuscular syringe  ADM 0.5ML IM UTD    [provider]  QUEtiapine (SEROQUEL) 25 MG tablet Take 25 mg by mouth at bedtime.    [provider]  simvastatin (ZOCOR) 20 MG tablet Take 20 mg by mouth daily.    [provider]  traZODone (DESYREL) 50 MG tablet Take 50 mg by mouth at bedtime.    [provider]  triamterene-hydrochlorothiazide (DYAZIDE) 37.5-25 MG capsule Take 1 capsule by mouth daily.    [provider]  vitamin B-12 (CYANOCOBALAMIN) 1000 MCG tablet Take 1,000 mcg by mouth daily.    [provider]    Allergies Neo-synephrine [phenylephrine hcl], Fluorescein, Latex, Phenylephrine, and Betadine [povidone iodine]  Family History  Problem Relation Age of Onset  . Dementia Mother   . Dementia Sister   . Breast cancer Paternal Aunt     Social History Social History   Tobacco Use  . Smoking status: Former Research scientist (life sciences)  . Smokeless tobacco: Never Used  Substance Use Topics  . Alcohol use: No  . Drug use: No    Review of Systems  Constitutional: No fever/chills Eyes: No visual changes. ENT: No sore throat. Cardiovascular: Denies chest pain. Respiratory: Denies shortness of breath. Gastrointestinal: No abdominal pain.  No nausea, no vomiting.  No diarrhea.  No constipation. Genitourinary: Negative for dysuria. Musculoskeletal: Negative for back pain. Skin: Negative for rash. Neurological: Negative for headaches, focal weakness or  numbness. Psychiatric: Depression Endocrine:  Hyperlipidemia Allergic/Immunilogical: Afrin, latex, Betadine. ____________________________________________   PHYSICAL EXAM:  VITAL SIGNS: ED Triage Vitals  Enc Vitals Group     BP 11/17/19 0741 (!) 158/80     Pulse Rate 11/17/19 0741 71     Resp 11/17/19 0741 18     Temp 11/17/19 0741 (!) 100.6 F (38.1 C)     Temp Source 11/17/19 0741 Oral     SpO2 11/17/19 0741 98 %     Weight 11/17/19 0741 120 lb (54.4 kg)     Height 11/17/19 0741 5' 2"  (1.575 m)     Head Circumference --      Peak Flow --      Pain Score 11/17/19 0748 5     Pain Loc --      Pain Edu? --      Excl. in  [place]? --    Constitutional: Alert and oriented. Well appearing and in no acute distress. Eyes: Conjunctivae are normal. PERRL. EOMI. Head: Atraumatic. Nose: No congestion/rhinnorhea. Mouth/Throat: Mucous membranes are moist.  Oropharynx non-erythematous. Neck: No stridor.  No cervical spine tenderness to palpation. Hematological/Lymphatic/Immunilogical: No cervical lymphadenopathy. Cardiovascular: Normal rate, regular rhythm. Grossly normal heart sounds.  Good peripheral circulation.  Evaded blood pressure. Respiratory: Normal respiratory effort.  No retractions. Lungs CTAB. Gastrointestinal: Soft and nontender. No distention. No abdominal bruits. No CVA tenderness. Genitourinary: Deferred Musculoskeletal: No lower extremity tenderness nor edema.  No joint effusions. Neurologic:  Normal speech and language. No gross focal neurologic deficits are appreciated. No gait instability. Skin:  Skin is warm, dry and intact. No rash noted. Psychiatric: Mood and affect are normal. Speech and behavior are normal.  ____________________________________________   LABS (all labs ordered are listed, but only abnormal results are displayed)  Labs Reviewed - No data to  display ____________________________________________  EKG   ____________________________________________  RADIOLOGY  ED MD interpretation:    Official radiology report(s): Ct Head Wo Contrast  Result Date: 11/17/2019 CLINICAL DATA:  Pain following fall EXAM: CT HEAD WITHOUT CONTRAST TECHNIQUE: Contiguous axial images were obtained from the base of the skull through the vertex without intravenous contrast. COMPARISON:  November 14, 2019 FINDINGS: Brain: There is mild diffuse atrophy. There is no intracranial mass, hemorrhage, extra-axial fluid collection, or midline shift. Patchy small vessel disease in the centra semiovale bilaterally is stable. No acute infarct is evident. Vascular: There is no hyperdense vessel. There is calcification in each carotid siphon region. Skull: The bony calvarium appears intact. There is a bandage overlying the upper right facial region. Sinuses/Orbits: There is mucosal thickening in several ethmoid air cells. There is a 5 mm osteoma in the right frontal sinus region. Calcification is noted along the lens of the eye on the right. Visualized orbits otherwise appear symmetric bilaterally. Other: Mastoid air cells are clear. IMPRESSION: Stable atrophy with patchy periventricular small vessel disease. No acute infarct. No mass or hemorrhage. There are foci of arterial vascular calcification. There is mucosal thickening in several ethmoid air cells. Calcification in a portion of the lens of the right eye noted. Electronically Signed   By: Lowella Grip III M.D.   On: 11/17/2019 08:42    ____________________________________________   PROCEDURES  Procedure(s) performed (including Critical Care):  Marland KitchenMarland KitchenLaceration Repair  Date/Time: 11/17/2019 9:56 AM Performed by: Sable Feil, PA-C Authorized by: Sable Feil, PA-C   Consent:    Consent obtained:  Verbal   Consent given by:  Patient   Risks discussed:  Infection, pain, poor cosmetic result and need for  additional repair Anesthesia (see MAR for exact dosages):    Anesthesia method:  Topical application and local infiltration   Topical anesthetic:  LET   Local anesthetic:  Lidocaine 1% w/o epi Laceration details:    Location:  Face   Face location:  R eyebrow   Length (cm):  1   Depth (mm):  2 Repair type:    Repair type:  Intermediate Pre-procedure details:    Preparation:  Imaging obtained to evaluate for foreign bodies Exploration:    Hemostasis achieved with:  Cautery, LET and direct pressure   Contaminated: no   Treatment:    Area cleansed with:  Saline   Amount of cleaning:  Standard Skin repair:    Repair method:  Sutures   Suture size:  5-0   Suture material:  Prolene   Suture  technique:  Simple interrupted   Number of sutures:  5 Approximation:    Approximation:  Close Post-procedure details:    Dressing:  Open (no dressing)   Patient tolerance of procedure:  Tolerated well, no immediate complications     ____________________________________________   INITIAL IMPRESSION / ASSESSMENT AND PLAN / ED COURSE  As part of my medical decision making, I reviewed the following data within the [place]     Patient presents with facial contusion laceration secondary to trip and fall.  No LOC.  Facial bleeding control was difficult secondary to patient anticoagulated medication.  Discussed negative CT findings.  See procedure note for wound closure.  Patient given discharge care instruction advised to have sutures removed in 10 days.    Megan Baker was evaluated in Emergency Department on 11/17/2019 for the symptoms described in the history of present illness. She was evaluated in the context of the global COVID-19 pandemic, which necessitated consideration that the patient might be at risk for infection with the SARS-CoV-2 virus that causes COVID-19. Institutional protocols and algorithms that pertain to the evaluation of patients at risk for COVID-19  are in a state of rapid change based on information released by regulatory bodies including the CDC and federal and state organizations. These policies and algorithms were followed during the patient's care in the ED.       ____________________________________________   FINAL CLINICAL IMPRESSION(S) / ED DIAGNOSES  Final diagnoses:  Facial laceration, initial encounter  Contusion of face, initial encounter     ED Discharge Orders    None       Note:  This document was prepared using Dragon voice recognition software and may include unintentional dictation errors.    Sable Feil, PA-C 11/17/19 1006    Harvest Dark, MD 11/17/19 [phone]

## 2019-11-17 NOTE — ED Triage Notes (Signed)
Pt here with c/o tripping and falling this am while getting the newspaper, lac above right eyebrow noted, bleeding controlled by dry dressing at this time. Takes plavix daily. Daughter states dementia, no change in mentation from fall. NAD.

## 2019-11-17 NOTE — ED Notes (Signed)
See triage note  Presents s/p fall  Tripped laceration noted over right eye  Denies any other sxs'

## 2019-11-17 NOTE — Discharge Instructions (Signed)
Follow discharge care instructions and have sutures removed in 5 days.  Suture may removed by family doctor or this department.

## 2019-11-17 NOTE — ED Notes (Signed)
Ice pack placed on pt's eye per PA Ron.

## 2019-12-04 ENCOUNTER — Encounter: Payer: Self-pay | Admitting: Emergency Medicine

## 2019-12-04 ENCOUNTER — Emergency Department
Admission: EM | Admit: 2019-12-04 | Discharge: 2019-12-04 | Disposition: A | Discharge status: Home / Self Care | Payer: Medicare Other | Attending: Emergency Medicine | Admitting: Emergency Medicine

## 2019-12-04 ENCOUNTER — Other Ambulatory Visit: Payer: Self-pay

## 2019-12-04 DIAGNOSIS — X58XXXD Exposure to other specified factors, subsequent encounter: Secondary | ICD-10-CM | POA: Diagnosis not present

## 2019-12-04 DIAGNOSIS — I10 Essential (primary) hypertension: Secondary | ICD-10-CM | POA: Diagnosis not present

## 2019-12-04 DIAGNOSIS — Z87891 Personal history of nicotine dependence: Secondary | ICD-10-CM | POA: Insufficient documentation

## 2019-12-04 DIAGNOSIS — Z79899 Other long term (current) drug therapy: Secondary | ICD-10-CM | POA: Insufficient documentation

## 2019-12-04 DIAGNOSIS — Z4802 Encounter for removal of sutures: Secondary | ICD-10-CM | POA: Diagnosis not present

## 2019-12-04 DIAGNOSIS — Z91040 Latex allergy status: Secondary | ICD-10-CM | POA: Diagnosis not present

## 2019-12-04 DIAGNOSIS — I2510 Atherosclerotic heart disease of native coronary artery without angina pectoris: Secondary | ICD-10-CM | POA: Diagnosis not present

## 2019-12-04 DIAGNOSIS — S01111D Laceration without foreign body of right eyelid and periocular area, subsequent encounter: Secondary | ICD-10-CM | POA: Insufficient documentation

## 2019-12-04 DIAGNOSIS — J449 Chronic obstructive pulmonary disease, unspecified: Secondary | ICD-10-CM | POA: Diagnosis not present

## 2019-12-04 DIAGNOSIS — Z7982 Long term (current) use of aspirin: Secondary | ICD-10-CM | POA: Insufficient documentation

## 2019-12-04 NOTE — ED Notes (Signed)
Stitches placed 11/25. Rt eyebrow

## 2019-12-04 NOTE — ED Notes (Signed)
Wound scabbed over. Maureen cleaning to enable getting out sutures.

## 2019-12-04 NOTE — ED Provider Notes (Signed)
[facility] Emergency Department Provider Note  ____________________________________________  Time seen: Approximately 2:08 PM  I have reviewed the triage vital signs and the nursing notes.   HISTORY  Chief Complaint No chief complaint on file.    HPI Megan Baker is a 78 y.o. female who presents the emergency department for suture removal of a laceration to the right eyebrow.  No complications with laceration.  Patient is here for suture removal only.         Past Medical History:  Diagnosis Date  . Allergic rhinitis   . Arthritis   . Autoimmune hepatitis ([place])   . Avascular necrosis of bone of hip, right ([place])   . Burning mouth syndrome   . Celiac sprue   . Connective tissue disease ([place])   . COPD (chronic obstructive pulmonary disease) ([employer])   . Coronary artery disease   . Depression   . Gall stones   . Hyperlipidemia   . Hypertension   . Pelvic prolapse   . Psoriasis     Patient Active Problem List   Diagnosis Date Noted  . Pelvic fracture ([place]) 09/27/2018  . Hyperlipidemia 03/10/2018  . Hypertension 03/10/2018  . Chronic mesenteric ischemia ([place]) 03/10/2018    Past Surgical History:  Procedure Laterality Date  . AMPUTATION FINGER    . CORONARY ANGIOPLASTY WITH STENT PLACEMENT    . ESOPHAGOGASTRODUODENOSCOPY (EGD) WITH PROPOFOL N/A 10/08/2016   Procedure: ESOPHAGOGASTRODUODENOSCOPY (EGD) WITH PROPOFOL;  Surgeon: Lollie Sails, MD;  Location: [facility] ENDOSCOPY;  Service: Endoscopy;  Laterality: N/A;  . EYE SURGERY    . LIVER BIOPSY    . MUSCLE BIOPSY    . TUBAL LIGATION    . VISCERAL ANGIOGRAPHY N/A 04/16/2018   Procedure: VISCERAL ANGIOGRAPHY;  Surgeon: Algernon Huxley, MD;  Location: [place] CV LAB;  Service: Cardiovascular;  Laterality: N/A;    Prior to Admission medications   Medication Sig Start Date End Date Taking? Authorizing Provider  acetaminophen (TYLENOL) 325 MG tablet Take 2 tablets (650 mg total) by mouth  every 6 (six) hours as needed for mild pain (or Fever >/= 101). 09/29/18   Dustin Flock, MD  albuterol (PROVENTIL HFA;VENTOLIN HFA) 108 (90 Base) MCG/ACT inhaler Inhale into the lungs every 6 (six) hours as needed for wheezing or shortness of breath.    [provider]  alendronate (FOSAMAX) 70 MG tablet Take 70 mg by mouth once a week. Take with a full glass of water on an empty stomach.    [provider]  aspirin EC 81 MG tablet Take 1 tablet (81 mg total) by mouth daily. 04/16/18   Algernon Huxley, MD  azaTHIOprine (IMURAN) 50 MG tablet Take 50 mg by mouth daily.    [provider]  buPROPion (WELLBUTRIN XL) 300 MG 24 hr tablet Take 300 mg by mouth daily.    [provider]  calcium carbonate (OSCAL) 1500 (600 Ca) MG TABS tablet Take 1,500 mg by mouth 2 (two) times daily with a meal.    [provider]  clopidogrel (PLAVIX) 75 MG tablet TAKE 1 TABLET BY MOUTH EVERY DAY 05/06/19   Algernon Huxley, MD  donepezil (ARICEPT) 5 MG tablet  03/29/19   [provider]  fexofenadine (ALLEGRA) 180 MG tablet Take by mouth. 07/13/19 07/12/20  [provider]  fluticasone (FLOVENT HFA) 110 MCG/ACT inhaler Inhale 1 puff into the lungs 2 (two) times daily.    [provider]  Fluticasone Propionate, Inhal, (FLOVENT  DISKUS) 100 MCG/BLIST AEPB Inhale into the lungs.    [provider]  Lidocaine HCl 4 % CREA Pain Relief (lidocaine) 4 % topical cream  APPLY 1 APPLICATION TOPICALLY 2 (TWO) TIMES DAILY AS NEEDED    [provider]  metoprolol succinate (TOPROL-XL) 100 MG 24 hr tablet Take 100 mg by mouth daily. Take with or immediately following a meal.    [provider]  multivitamin-iron-minerals-folic acid (CENTRUM) chewable tablet Chew 1 tablet by mouth daily.    [provider]  nystatin cream (MYCOSTATIN)  07/21/15   [provider]  olmesartan (BENICAR) 40 MG tablet Take 40 mg by mouth daily.     [provider]  omeprazole (PRILOSEC) 20 MG capsule Take 20 mg by mouth 2 (two) times daily before a meal.    [provider]  pneumococcal 13-valent conjugate vaccine (PREVNAR 13) SUSP injection Prevnar 13 (PF) 0.5 mL intramuscular syringe  ADM 0.5ML IM UTD    [provider]  QUEtiapine (SEROQUEL) 25 MG tablet Take 25 mg by mouth at bedtime.    [provider]  simvastatin (ZOCOR) 20 MG tablet Take 20 mg by mouth daily.    [provider]  traZODone (DESYREL) 50 MG tablet Take 50 mg by mouth at bedtime.    [provider]  triamterene-hydrochlorothiazide (DYAZIDE) 37.5-25 MG capsule Take 1 capsule by mouth daily.    [provider]  vitamin B-12 (CYANOCOBALAMIN) 1000 MCG tablet Take 1,000 mcg by mouth daily.    [provider]    Allergies Neo-synephrine [phenylephrine hcl], Fluorescein, Latex, Phenylephrine, and Betadine [povidone iodine]  Family History  Problem Relation Age of Onset  . Dementia Mother   . Dementia Sister   . Breast cancer Paternal Aunt     Social History Social History   Tobacco Use  . Smoking status: Former Research scientist (life sciences)  . Smokeless tobacco: Never Used  Substance Use Topics  . Alcohol use: No  . Drug use: No     Review of Systems  Constitutional: No fever/chills Eyes: No visual changes. No discharge ENT: No upper respiratory complaints. Cardiovascular: no chest pain. Respiratory: no cough. No SOB. Musculoskeletal: Negative for musculoskeletal pain. Skin: Here for suture removal from right eyebrow Neurological: Negative for headaches, focal weakness or numbness. 10-point ROS otherwise negative.  ____________________________________________   PHYSICAL EXAM:  VITAL SIGNS: ED Triage Vitals  Enc Vitals Group     BP 12/04/19 1334 104/74     Pulse Rate 12/04/19 1334 84     Resp 12/04/19 1334 17     Temp 12/04/19 1334 99.3 F (37.4 C)     Temp Source 12/04/19 1334 Oral     SpO2  12/04/19 1334 99 %     Weight 12/04/19 1335 125 lb (56.7 kg)     Height 12/04/19 1335 5' 2"  (1.575 m)     Head Circumference --      Peak Flow --      Pain Score 12/04/19 1358 0     Pain Loc --      Pain Edu? --      Excl. in [place]? --      Constitutional: Alert and oriented. Well appearing and in no acute distress. Eyes: Conjunctivae are normal. PERRL. EOMI. Head: Sutured laceration to the right eyebrow.  No erythema, edema.  No drainage.  Area is well-healed.  Examination after suture removal reveals no dehiscence. ENT:      Ears:  Nose: No congestion/rhinnorhea.      Mouth/Throat: Mucous membranes are moist.  Neck: No stridor.    Cardiovascular: Normal rate, regular rhythm. Normal S1 and S2.  Good peripheral circulation. Respiratory: Normal respiratory effort without tachypnea or retractions. Lungs CTAB. Good air entry to the bases with no decreased or absent breath sounds. Musculoskeletal: Full range of motion to all extremities. No gross deformities appreciated. Neurologic:  Normal speech and language. No gross focal neurologic deficits are appreciated.  Skin:  Skin is warm, dry and intact. No rash noted. Psychiatric: Mood and affect are normal. Speech and behavior are normal. Patient exhibits appropriate insight and judgement.   ____________________________________________   LABS (all labs ordered are listed, but only abnormal results are displayed)  Labs Reviewed - No data to display ____________________________________________  EKG   ____________________________________________  RADIOLOGY   No results found.  ____________________________________________    PROCEDURES  Procedure(s) performed:    .Suture Removal  Date/Time: 12/04/2019 2:09 PM Performed by: Darletta Moll, PA-C Authorized by: Darletta Moll, PA-C   Consent:    Consent obtained:  Verbal   Consent given by:  Patient   Risks discussed:  Bleeding, pain and wound  separation Location:    Location:  Head/neck   Head/neck location:  Eyebrow   Eyebrow location:  R eyebrow Procedure details:    Wound appearance:  Good wound healing   Number of sutures removed:  6 Post-procedure details:    Post-removal:  No dressing applied   Patient tolerance of procedure:  Tolerated well, no immediate complications      Medications - No data to display   ____________________________________________   INITIAL IMPRESSION / ASSESSMENT AND PLAN / ED COURSE  Pertinent labs & imaging results that were available during my care of the patient were reviewed by me and considered in my medical decision making (see chart for details).  Review of the [place] CSRS was performed in accordance of the [place] prior to dispensing any controlled drugs.           Patient's diagnosis is consistent with encounter for suture removal.  Patient presents the emergency department for suture removal only.  No complications.  No other complaints.  Sutures are successfully removed with no wound dehiscence.  No signs of infection..  No medications at this time.  Follow-up primary care as needed.  Patient is given ED precautions to return to the ED for any worsening or new symptoms.     ____________________________________________  FINAL CLINICAL IMPRESSION(S) / ED DIAGNOSES  Final diagnoses:  Visit for suture removal      NEW MEDICATIONS STARTED DURING THIS VISIT:  ED Discharge Orders    None          This chart was dictated using voice recognition software/Dragon. Despite best efforts to proofread, errors can occur which can change the meaning. Any change was purely unintentional.    Darletta Moll, PA-C 12/04/19 1411    Blake Divine, MD 12/04/19 [phone]

## 2019-12-04 NOTE — ED Notes (Signed)
6 stitches removed by EDP. Suture site clean and well approximated. No drainage or redness noted. Pt tolerated procedure well.

## 2020-03-18 ENCOUNTER — Ambulatory Visit: Payer: Medicare Other | Attending: Internal Medicine

## 2020-03-18 DIAGNOSIS — Z23 Encounter for immunization: Secondary | ICD-10-CM

## 2020-03-18 NOTE — Progress Notes (Signed)
   Covid-19 Vaccination Clinic  Name:  Megan Baker    MRN: [id number] DOB: 08/16/1941  03/18/2020  [name] was observed post Covid-19 immunization for 15 minutes without incident. She was provided with Vaccine Information Sheet and instruction to access the V-Safe system.   [name] was instructed to call 911 with any severe reactions post vaccine: Marland Kitchen Difficulty breathing  . Swelling of face and throat  . A fast heartbeat  . A bad rash all over body  . Dizziness and weakness   Immunizations Administered    Name Date Dose VIS Date Route   Pfizer COVID-19 Vaccine 03/18/2020 10:12 AM 0.3 mL 12/03/2019 Intramuscular   Manufacturer: [employer]   Lot: [id number]   [place]: [address]-8

## 2020-03-27 ENCOUNTER — Emergency Department: Payer: Medicare Other

## 2020-03-27 ENCOUNTER — Emergency Department
Admission: EM | Admit: 2020-03-27 | Discharge: 2020-03-28 | Disposition: A | Discharge status: Home / Self Care | Payer: Medicare Other | Attending: Emergency Medicine | Admitting: Emergency Medicine

## 2020-03-27 ENCOUNTER — Other Ambulatory Visit: Payer: Self-pay

## 2020-03-27 DIAGNOSIS — Z7982 Long term (current) use of aspirin: Secondary | ICD-10-CM | POA: Diagnosis not present

## 2020-03-27 DIAGNOSIS — Z7902 Long term (current) use of antithrombotics/antiplatelets: Secondary | ICD-10-CM | POA: Insufficient documentation

## 2020-03-27 DIAGNOSIS — I10 Essential (primary) hypertension: Secondary | ICD-10-CM | POA: Diagnosis not present

## 2020-03-27 DIAGNOSIS — Z87891 Personal history of nicotine dependence: Secondary | ICD-10-CM | POA: Insufficient documentation

## 2020-03-27 DIAGNOSIS — Z79899 Other long term (current) drug therapy: Secondary | ICD-10-CM | POA: Insufficient documentation

## 2020-03-27 DIAGNOSIS — I2510 Atherosclerotic heart disease of native coronary artery without angina pectoris: Secondary | ICD-10-CM | POA: Insufficient documentation

## 2020-03-27 DIAGNOSIS — R0602 Shortness of breath: Secondary | ICD-10-CM | POA: Insufficient documentation

## 2020-03-27 DIAGNOSIS — J449 Chronic obstructive pulmonary disease, unspecified: Secondary | ICD-10-CM | POA: Diagnosis not present

## 2020-03-27 DIAGNOSIS — Z91040 Latex allergy status: Secondary | ICD-10-CM | POA: Diagnosis not present

## 2020-03-27 DIAGNOSIS — Z955 Presence of coronary angioplasty implant and graft: Secondary | ICD-10-CM | POA: Diagnosis not present

## 2020-03-27 DIAGNOSIS — Z20822 Contact with and (suspected) exposure to covid-19: Secondary | ICD-10-CM | POA: Insufficient documentation

## 2020-03-27 DIAGNOSIS — R0600 Dyspnea, unspecified: Secondary | ICD-10-CM

## 2020-03-27 LAB — CBC WITH DIFFERENTIAL/PLATELET
Abs Immature Granulocytes: 0.01 10*3/uL (ref 0.00–0.07)
Basophils Absolute: 0.1 10*3/uL (ref 0.0–0.1)
Basophils Relative: 1 %
Eosinophils Absolute: 0.3 10*3/uL (ref 0.0–0.5)
Eosinophils Relative: 5 %
HCT: 38 % (ref 36.0–46.0)
Hemoglobin: 12.6 g/dL (ref 12.0–15.0)
Immature Granulocytes: 0 %
Lymphocytes Relative: 44 %
Lymphs Abs: 2.5 10*3/uL (ref 0.7–4.0)
MCH: 31.3 pg (ref 26.0–34.0)
MCHC: 33.2 g/dL (ref 30.0–36.0)
MCV: 94.5 fL (ref 80.0–100.0)
Monocytes Absolute: 0.6 10*3/uL (ref 0.1–1.0)
Monocytes Relative: 11 %
Neutro Abs: 2.3 10*3/uL (ref 1.7–7.7)
Neutrophils Relative %: 39 %
Platelets: 382 10*3/uL (ref 150–400)
RBC: 4.02 MIL/uL (ref 3.87–5.11)
RDW: 11.9 % (ref 11.5–15.5)
WBC: 5.9 10*3/uL (ref 4.0–10.5)
nRBC: 0 % (ref 0.0–0.2)

## 2020-03-27 LAB — BASIC METABOLIC PANEL
Anion gap: 8 (ref 5–15)
BUN: 13 mg/dL (ref 8–23)
CO2: 29 mmol/L (ref 22–32)
Calcium: 9.8 mg/dL (ref 8.9–10.3)
Chloride: 101 mmol/L (ref 98–111)
Creatinine, Ser: 0.82 mg/dL (ref 0.44–1.00)
GFR calc Af Amer: >60 mL/min (ref >60)
GFR calc non Af Amer: >60 mL/min (ref >60)
Glucose, Bld: 94 mg/dL (ref 70–99)
Potassium: 3.9 mmol/L (ref 3.5–5.1)
Sodium: 138 mmol/L (ref 135–145)

## 2020-03-27 LAB — TROPONIN I (HIGH SENSITIVITY): Troponin I (High Sensitivity): 7 ng/L (ref <18)

## 2020-03-27 NOTE — ED Notes (Signed)
When asking pt if she has thoughts of harming herself she shakes her head "yes". This RN asked pt if she had the means or a plan to harm herself and she states yes. Pt contracted for safety with this RN stating she would not attempt to harm herself while in the hospital. Charge RN Butch informed and safety sitter now at bedside. Pt clothed in hospital gown until medically cleared by EDP. Belongings removed by tech placed in belonging bag and labeled with pt label

## 2020-03-27 NOTE — ED Triage Notes (Signed)
PT to ED via EMS from home c/o SOB. PT on room air satting 100%. Having trouble exhaling, almost having a panting breath. PT hx of anxiety, recently taken off of meds. VSS.

## 2020-03-27 NOTE — ED Provider Notes (Signed)
[facility] Emergency Department Provider Note  ____________________________________________   I have reviewed the triage vital signs and the nursing notes.   HISTORY  Chief Complaint Shortness of Breath   History limited by: Not Limited   HPI Megan Baker is a 79 y.o. female who presents to the emergency department today because of concerns for shortness of breath.  The patient says that the shortness of breath started today.  She has also been complaining of some twitching of her right arm over the past couple of days.  By the time my exam the patient thinks that her shortness of breath is better.  She denies any chest pain with the shortness of breath.  Denies any cough or fever.  Family states she has not had any Covid contact.  There was some concern from triage about possible depression and suicidal ideation.  Family states that there was some confusion and they were discussing the way she felt last year.  Family states that she has not exhibited any signs of depression recently and patient denies any thoughts of self-harm.  Records reviewed. Per medical record review patient has a history of COPD, HLD, HTN  Past Medical History:  Diagnosis Date  . Allergic rhinitis   . Arthritis   . Autoimmune hepatitis ([place])   . Avascular necrosis of bone of hip, right ([place])   . Burning mouth syndrome   . Celiac sprue   . Connective tissue disease ([place])   . COPD (chronic obstructive pulmonary disease) ([place])   . Coronary artery disease   . Depression   . Gall stones   . Hyperlipidemia   . Hypertension   . Pelvic prolapse   . Psoriasis     Patient Active Problem List   Diagnosis Date Noted  . Pelvic fracture ([place]) 09/27/2018  . Hyperlipidemia 03/10/2018  . Hypertension 03/10/2018  . Chronic mesenteric ischemia ([place]) 03/10/2018    Past Surgical History:  Procedure Laterality Date  . AMPUTATION FINGER    . CORONARY ANGIOPLASTY WITH STENT PLACEMENT    .  ESOPHAGOGASTRODUODENOSCOPY (EGD) WITH PROPOFOL N/A 10/08/2016   Procedure: ESOPHAGOGASTRODUODENOSCOPY (EGD) WITH PROPOFOL;  Surgeon: Lollie Sails, MD;  Location: [facility] ENDOSCOPY;  Service: Endoscopy;  Laterality: N/A;  . EYE SURGERY    . LIVER BIOPSY    . MUSCLE BIOPSY    . TUBAL LIGATION    . VISCERAL ANGIOGRAPHY N/A 04/16/2018   Procedure: VISCERAL ANGIOGRAPHY;  Surgeon: Algernon Huxley, MD;  Location: [place] CV LAB;  Service: Cardiovascular;  Laterality: N/A;    Prior to Admission medications   Medication Sig Start Date End Date Taking? Authorizing Provider  acetaminophen (TYLENOL) 325 MG tablet Take 2 tablets (650 mg total) by mouth every 6 (six) hours as needed for mild pain (or Fever >/= 101). 09/29/18   Dustin Flock, MD  albuterol (PROVENTIL HFA;VENTOLIN HFA) 108 (90 Base) MCG/ACT inhaler Inhale into the lungs every 6 (six) hours as needed for wheezing or shortness of breath.    [provider]  alendronate (FOSAMAX) 70 MG tablet Take 70 mg by mouth once a week. Take with a full glass of water on an empty stomach.    [provider]  aspirin EC 81 MG tablet Take 1 tablet (81 mg total) by mouth daily. 04/16/18   Algernon Huxley, MD  azaTHIOprine (IMURAN) 50 MG tablet Take 50 mg by mouth daily.    [provider]  buPROPion (WELLBUTRIN XL) 300 MG 24 hr  tablet Take 300 mg by mouth daily.    [provider]  calcium carbonate (OSCAL) 1500 (600 Ca) MG TABS tablet Take 1,500 mg by mouth 2 (two) times daily with a meal.    [provider]  clopidogrel (PLAVIX) 75 MG tablet TAKE 1 TABLET BY MOUTH EVERY DAY 05/06/19   Algernon Huxley, MD  donepezil (ARICEPT) 5 MG tablet  03/29/19   [provider]  fexofenadine (ALLEGRA) 180 MG tablet Take by mouth. 07/13/19 07/12/20  [provider]  fluticasone (FLOVENT HFA) 110 MCG/ACT inhaler Inhale 1 puff into the lungs 2 (two) times daily.    [provider]  Fluticasone Propionate,  Inhal, (FLOVENT DISKUS) 100 MCG/BLIST AEPB Inhale into the lungs.    [provider]  Lidocaine HCl 4 % CREA Pain Relief (lidocaine) 4 % topical cream  APPLY 1 APPLICATION TOPICALLY 2 (TWO) TIMES DAILY AS NEEDED    [provider]  metoprolol succinate (TOPROL-XL) 100 MG 24 hr tablet Take 100 mg by mouth daily. Take with or immediately following a meal.    [provider]  multivitamin-iron-minerals-folic acid (CENTRUM) chewable tablet Chew 1 tablet by mouth daily.    [provider]  nystatin cream (MYCOSTATIN)  07/21/15   [provider]  olmesartan (BENICAR) 40 MG tablet Take 40 mg by mouth daily.    [provider]  omeprazole (PRILOSEC) 20 MG capsule Take 20 mg by mouth 2 (two) times daily before a meal.    [provider]  pneumococcal 13-valent conjugate vaccine (PREVNAR 13) SUSP injection Prevnar 13 (PF) 0.5 mL intramuscular syringe  ADM 0.5ML IM UTD    [provider]  QUEtiapine (SEROQUEL) 25 MG tablet Take 25 mg by mouth at bedtime.    [provider]  simvastatin (ZOCOR) 20 MG tablet Take 20 mg by mouth daily.    [provider]  traZODone (DESYREL) 50 MG tablet Take 50 mg by mouth at bedtime.    [provider]  triamterene-hydrochlorothiazide (DYAZIDE) 37.5-25 MG capsule Take 1 capsule by mouth daily.    [provider]  vitamin B-12 (CYANOCOBALAMIN) 1000 MCG tablet Take 1,000 mcg by mouth daily.    [provider]    Allergies Neo-synephrine [phenylephrine hcl], Fluorescein, Latex, Phenylephrine, and Betadine [povidone iodine]  Family History  Problem Relation Age of Onset  . Dementia Mother   . Dementia Sister   . Breast cancer Paternal Aunt     Social History Social History   Tobacco Use  . Smoking status: Former Research scientist (life sciences)  . Smokeless tobacco: Never Used  Substance Use Topics  . Alcohol use: No  . Drug use: No    Review of Systems Constitutional: No  fever/chills Eyes: No visual changes. ENT: No sore throat. Cardiovascular: Denies chest pain. Respiratory: Positive for shortness of breath. Gastrointestinal: No abdominal pain.  No nausea, no vomiting.  No diarrhea.   Genitourinary: Negative for dysuria. Musculoskeletal: Negative for back pain. Skin: Negative for rash. Neurological: Negative for headaches, focal weakness or numbness.  ____________________________________________   PHYSICAL EXAM:  VITAL SIGNS: ED Triage Vitals  Enc Vitals Group     BP 03/27/20 2107 (!) 160/82     Pulse Rate 03/27/20 2050 69     Resp 03/27/20 2050 (!) 22     Temp 03/27/20 2050 98.3 F (36.8 C)     Temp Source 03/27/20 2050 Oral     SpO2 03/27/20 2047 100 %     Weight 03/27/20 2053  130 lb (59 kg)     Height 03/27/20 2053 5' 2"  (1.575 m)     Head Circumference --      Peak Flow --      Pain Score 03/27/20 2052 0   Constitutional: Alert and oriented.  Eyes: Conjunctivae are normal.  ENT      Head: Normocephalic and atraumatic.      Nose: No congestion/rhinnorhea.      Mouth/Throat: Mucous membranes are moist.      Neck: No stridor. Hematological/Lymphatic/Immunilogical: No cervical lymphadenopathy. Cardiovascular: Normal rate, regular rhythm.  No murmurs, rubs, or gallops.  Respiratory: Normal respiratory effort without tachypnea nor retractions. Breath sounds are clear and equal bilaterally. No wheezes/rales/rhonchi. Gastrointestinal: Soft and non tender. No rebound. No guarding.  Genitourinary: Deferred Musculoskeletal: Normal range of motion in all extremities. No lower extremity edema. Neurologic:  Normal speech and language. No gross focal neurologic deficits are appreciated.  Skin:  Skin is warm, dry and intact. No rash noted. Psychiatric: Mood and affect are normal. Speech and behavior are normal. Patient exhibits appropriate insight and judgment.  ____________________________________________    LABS (pertinent  positives/negatives)  Trop hs 7 CBC wbc 5.9, hgb 12.6, plt 382 BMP wnl  ____________________________________________   EKG  I, Nance Pear, attending physician, personally viewed and interpreted this EKG  EKG Time: 2059 Rate: 68 Rhythm: sinus rhythm Axis: normal Intervals: qtc 501 QRS: narrow ST changes: no st elevation Impression: abnormal ekg   ____________________________________________    RADIOLOGY  CXR No acute disease  ____________________________________________   PROCEDURES  Procedures  ____________________________________________   INITIAL IMPRESSION / ASSESSMENT AND PLAN / ED COURSE  Pertinent labs & imaging results that were available during my care of the patient were reviewed by me and considered in my medical decision making (see chart for details).   Patient presented to the emergency department today because of concerns for shortness of breath.  By the time my exam she states she no longer is having shortness of breath.  Chest x-ray without any concerning pneumonia or pneumothorax.  Blood work with initial normal troponin and no signs of concerning anemia or leukocytosis.  Given age and episode of shortness of breath will check second troponin.  If negative I do think it be reasonable for patient be discharged home.  In terms of any thoughts of depression.  Patient denies any current thoughts of self-harm or depression.  Daughter also states patient seems to have been in a good mood recently.  They both feel comfortable without psychiatric evaluation I think this is reasonable.  ____________________________________________   FINAL CLINICAL IMPRESSION(S) / ED DIAGNOSES  Shortness of breath  Note: This dictation was prepared with Dragon dictation. Any transcriptional errors that result from this process are unintentional     Nance Pear, MD 03/28/20 [phone]

## 2020-03-28 LAB — URINALYSIS, COMPLETE (UACMP) WITH MICROSCOPIC
Bacteria, UA: NONE SEEN
Bilirubin Urine: NEGATIVE
Glucose, UA: NEGATIVE mg/dL
Hgb urine dipstick: NEGATIVE
Ketones, ur: NEGATIVE mg/dL
Nitrite: NEGATIVE
Protein, ur: NEGATIVE mg/dL
Specific Gravity, Urine: 1.009 (ref 1.005–1.030)
pH: 8 (ref 5.0–8.0)

## 2020-03-28 LAB — RESPIRATORY PANEL BY RT PCR (FLU A&B, COVID)
Influenza A by PCR: NEGATIVE
Influenza B by PCR: NEGATIVE
SARS Coronavirus 2 by RT PCR: NEGATIVE

## 2020-03-28 LAB — TROPONIN I (HIGH SENSITIVITY): Troponin I (High Sensitivity): 9 ng/L (ref <18)

## 2020-04-08 ENCOUNTER — Ambulatory Visit: Payer: Medicare Other | Attending: Internal Medicine

## 2020-04-08 DIAGNOSIS — Z23 Encounter for immunization: Secondary | ICD-10-CM

## 2020-04-08 NOTE — Progress Notes (Signed)
   Covid-19 Vaccination Clinic  Name:  DANALEE FLATH    MRN: [id number] DOB: 1941/06/22  04/08/2020  Ms. Caroll was observed post Covid-19 immunization for 15 minutes   without incident. She was provided with Vaccine Information Sheet and instruction to access the V-Safe system.   Ms. Rochette was instructed to call 911 with any severe reactions post vaccine: Marland Kitchen Difficulty breathing  . Swelling of face and throat  . A fast heartbeat  . A bad rash all over body  . Dizziness and weakness   Immunizations Administered    Name Date Dose VIS Date Route   Pfizer COVID-19 Vaccine 04/08/2020  9:48 AM 0.3 mL 12/03/2019 Intramuscular   Manufacturer: [employer]   Lot: [id number]   [place]: [address]-2

## 2020-06-24 ENCOUNTER — Other Ambulatory Visit (INDEPENDENT_AMBULATORY_CARE_PROVIDER_SITE_OTHER): Payer: Self-pay | Admitting: Vascular Surgery

## 2020-07-01 ENCOUNTER — Emergency Department
Admission: EM | Admit: 2020-07-01 | Discharge: 2020-07-03 | Disposition: A | Discharge status: Other Acute Inpatient Hospital | Payer: Medicare Other | Attending: Emergency Medicine | Admitting: Emergency Medicine

## 2020-07-01 ENCOUNTER — Other Ambulatory Visit: Payer: Self-pay

## 2020-07-01 ENCOUNTER — Encounter: Payer: Self-pay | Admitting: *Deleted

## 2020-07-01 DIAGNOSIS — Z79899 Other long term (current) drug therapy: Secondary | ICD-10-CM | POA: Diagnosis not present

## 2020-07-01 DIAGNOSIS — Z7982 Long term (current) use of aspirin: Secondary | ICD-10-CM | POA: Insufficient documentation

## 2020-07-01 DIAGNOSIS — F329 Major depressive disorder, single episode, unspecified: Secondary | ICD-10-CM | POA: Diagnosis not present

## 2020-07-01 DIAGNOSIS — F32A Depression, unspecified: Secondary | ICD-10-CM | POA: Insufficient documentation

## 2020-07-01 DIAGNOSIS — Z7902 Long term (current) use of antithrombotics/antiplatelets: Secondary | ICD-10-CM | POA: Diagnosis not present

## 2020-07-01 DIAGNOSIS — J449 Chronic obstructive pulmonary disease, unspecified: Secondary | ICD-10-CM | POA: Insufficient documentation

## 2020-07-01 DIAGNOSIS — I2510 Atherosclerotic heart disease of native coronary artery without angina pectoris: Secondary | ICD-10-CM | POA: Insufficient documentation

## 2020-07-01 DIAGNOSIS — Z20822 Contact with and (suspected) exposure to covid-19: Secondary | ICD-10-CM | POA: Diagnosis not present

## 2020-07-01 DIAGNOSIS — Z91040 Latex allergy status: Secondary | ICD-10-CM | POA: Insufficient documentation

## 2020-07-01 DIAGNOSIS — I10 Essential (primary) hypertension: Secondary | ICD-10-CM | POA: Diagnosis not present

## 2020-07-01 DIAGNOSIS — Z87891 Personal history of nicotine dependence: Secondary | ICD-10-CM | POA: Insufficient documentation

## 2020-07-01 DIAGNOSIS — F333 Major depressive disorder, recurrent, severe with psychotic symptoms: Secondary | ICD-10-CM | POA: Insufficient documentation

## 2020-07-01 DIAGNOSIS — Z955 Presence of coronary angioplasty implant and graft: Secondary | ICD-10-CM | POA: Diagnosis not present

## 2020-07-01 DIAGNOSIS — Z7951 Long term (current) use of inhaled steroids: Secondary | ICD-10-CM | POA: Diagnosis not present

## 2020-07-01 LAB — COMPREHENSIVE METABOLIC PANEL
ALT: 14 U/L (ref 0–44)
AST: 26 U/L (ref 15–41)
Albumin: 4 g/dL (ref 3.5–5.0)
Alkaline Phosphatase: 33 U/L — ABNORMAL LOW (ref 38–126)
Anion gap: 9 (ref 5–15)
BUN: 11 mg/dL (ref 8–23)
CO2: 24 mmol/L (ref 22–32)
Calcium: 9.1 mg/dL (ref 8.9–10.3)
Chloride: 107 mmol/L (ref 98–111)
Creatinine, Ser: 0.92 mg/dL (ref 0.44–1.00)
GFR calc Af Amer: >60 mL/min (ref >60)
GFR calc non Af Amer: 60 mL/min — ABNORMAL LOW (ref >60)
Glucose, Bld: 114 mg/dL — ABNORMAL HIGH (ref 70–99)
Potassium: 3.4 mmol/L — ABNORMAL LOW (ref 3.5–5.1)
Sodium: 140 mmol/L (ref 135–145)
Total Bilirubin: 0.6 mg/dL (ref 0.3–1.2)
Total Protein: 7.5 g/dL (ref 6.5–8.1)

## 2020-07-01 LAB — URINE DRUG SCREEN, QUALITATIVE (ARMC ONLY)
Amphetamines, Ur Screen: NOT DETECTED
Barbiturates, Ur Screen: NOT DETECTED
Benzodiazepine, Ur Scrn: NOT DETECTED
Cannabinoid 50 Ng, Ur ~~LOC~~: NOT DETECTED
Cocaine Metabolite,Ur ~~LOC~~: NOT DETECTED
MDMA (Ecstasy)Ur Screen: NOT DETECTED
Methadone Scn, Ur: NOT DETECTED
Opiate, Ur Screen: NOT DETECTED
Phencyclidine (PCP) Ur S: NOT DETECTED
Tricyclic, Ur Screen: POSITIVE — AB

## 2020-07-01 LAB — CBC WITH DIFFERENTIAL/PLATELET
Abs Immature Granulocytes: 0.01 10*3/uL (ref 0.00–0.07)
Basophils Absolute: 0 10*3/uL (ref 0.0–0.1)
Basophils Relative: 0 %
Eosinophils Absolute: 0.3 10*3/uL (ref 0.0–0.5)
Eosinophils Relative: 7 %
HCT: 33.6 % — ABNORMAL LOW (ref 36.0–46.0)
Hemoglobin: 11.5 g/dL — ABNORMAL LOW (ref 12.0–15.0)
Immature Granulocytes: 0 %
Lymphocytes Relative: 39 %
Lymphs Abs: 1.8 10*3/uL (ref 0.7–4.0)
MCH: 32 pg (ref 26.0–34.0)
MCHC: 34.2 g/dL (ref 30.0–36.0)
MCV: 93.6 fL (ref 80.0–100.0)
Monocytes Absolute: 0.5 10*3/uL (ref 0.1–1.0)
Monocytes Relative: 10 %
Neutro Abs: 2.1 10*3/uL (ref 1.7–7.7)
Neutrophils Relative %: 44 %
Platelets: 354 10*3/uL (ref 150–400)
RBC: 3.59 MIL/uL — ABNORMAL LOW (ref 3.87–5.11)
RDW: 13.2 % (ref 11.5–15.5)
WBC: 4.8 10*3/uL (ref 4.0–10.5)
nRBC: 0 % (ref 0.0–0.2)

## 2020-07-01 LAB — SALICYLATE LEVEL: Salicylate Lvl: <7 mg/dL — ABNORMAL LOW (ref 7.0–30.0)

## 2020-07-01 LAB — URINALYSIS, ROUTINE W REFLEX MICROSCOPIC
Bilirubin Urine: NEGATIVE
Glucose, UA: NEGATIVE mg/dL
Hgb urine dipstick: NEGATIVE
Ketones, ur: 5 mg/dL — AB
Leukocytes,Ua: NEGATIVE
Nitrite: NEGATIVE
Protein, ur: NEGATIVE mg/dL
Specific Gravity, Urine: 1.017 (ref 1.005–1.030)
pH: 6 (ref 5.0–8.0)

## 2020-07-01 LAB — ETHANOL: Alcohol, Ethyl (B): <10 mg/dL (ref <10)

## 2020-07-01 LAB — SARS CORONAVIRUS 2 BY RT PCR (HOSPITAL ORDER, PERFORMED IN ~~LOC~~ HOSPITAL LAB): SARS Coronavirus 2: NEGATIVE

## 2020-07-01 LAB — ACETAMINOPHEN LEVEL: Acetaminophen (Tylenol), Serum: <10 ug/mL — ABNORMAL LOW (ref 10–30)

## 2020-07-01 MED ORDER — CALCIUM CARBONATE ANTACID 500 MG PO CHEW
1500.0000 mg | CHEWABLE_TABLET | Freq: Two times a day (BID) | ORAL | Status: DC
  Administered 2020-07-01 – 2020-07-03 (×5): 1500 mg via ORAL
  Filled 2020-07-01 (×4): qty 8

## 2020-07-01 MED ORDER — QUETIAPINE FUMARATE 25 MG PO TABS
25.0000 mg | ORAL_TABLET | Freq: Every day | ORAL | Status: DC
  Administered 2020-07-01 – 2020-07-03 (×3): 25 mg via ORAL
  Filled 2020-07-01 (×3): qty 1

## 2020-07-01 MED ORDER — TRAZODONE HCL 50 MG PO TABS
50.0000 mg | ORAL_TABLET | Freq: Every day | ORAL | Status: DC
  Administered 2020-07-01: 50 mg via ORAL
  Filled 2020-07-01 (×2): qty 1

## 2020-07-01 MED ORDER — ALBUTEROL SULFATE (2.5 MG/3ML) 0.083% IN NEBU: 3.0000 mL | INHALATION_SOLUTION | Freq: Four times a day (QID) | RESPIRATORY_TRACT | Status: DC | PRN

## 2020-07-01 MED ORDER — IRBESARTAN 150 MG PO TABS
150.0000 mg | ORAL_TABLET | Freq: Every day | ORAL | Status: DC
  Administered 2020-07-01 – 2020-07-02 (×2): 150 mg via ORAL
  Filled 2020-07-01 (×3): qty 1

## 2020-07-01 MED ORDER — DONEPEZIL HCL 5 MG PO TABS
5.0000 mg | ORAL_TABLET | Freq: Every day | ORAL | Status: DC
  Administered 2020-07-01: 5 mg via ORAL
  Filled 2020-07-01 (×2): qty 1

## 2020-07-01 MED ORDER — LORATADINE 10 MG PO TABS
10.0000 mg | ORAL_TABLET | Freq: Every day | ORAL | Status: DC
  Administered 2020-07-01 – 2020-07-03 (×3): 10 mg via ORAL
  Filled 2020-07-01 (×3): qty 1

## 2020-07-01 MED ORDER — CLOPIDOGREL BISULFATE 75 MG PO TABS
75.0000 mg | ORAL_TABLET | Freq: Every day | ORAL | Status: DC
  Administered 2020-07-01 – 2020-07-03 (×3): 75 mg via ORAL
  Filled 2020-07-01 (×3): qty 1

## 2020-07-01 MED ORDER — ASPIRIN EC 81 MG PO TBEC
81.0000 mg | DELAYED_RELEASE_TABLET | Freq: Every day | ORAL | Status: DC
  Administered 2020-07-01 – 2020-07-03 (×3): 81 mg via ORAL
  Filled 2020-07-01 (×3): qty 1

## 2020-07-01 MED ORDER — METOPROLOL SUCCINATE ER 50 MG PO TB24
100.0000 mg | ORAL_TABLET | Freq: Every day | ORAL | Status: DC
  Administered 2020-07-01 – 2020-07-03 (×3): 100 mg via ORAL
  Filled 2020-07-01 (×3): qty 2

## 2020-07-01 MED ORDER — UMECLIDINIUM-VILANTEROL 62.5-25 MCG/INH IN AEPB
1.0000 | INHALATION_SPRAY | Freq: Every day | RESPIRATORY_TRACT | Status: DC
  Administered 2020-07-01 – 2020-07-02 (×2): 1 via RESPIRATORY_TRACT
  Filled 2020-07-01: qty 14

## 2020-07-01 MED ORDER — ADULT MULTIVITAMIN W/MINERALS CH
1.0000 | ORAL_TABLET | Freq: Every day | ORAL | Status: DC
  Administered 2020-07-01 – 2020-07-03 (×3): 1 via ORAL
  Filled 2020-07-01 (×3): qty 1

## 2020-07-01 MED ORDER — PANTOPRAZOLE SODIUM 40 MG PO TBEC
40.0000 mg | DELAYED_RELEASE_TABLET | Freq: Two times a day (BID) | ORAL | Status: DC
  Administered 2020-07-01 – 2020-07-03 (×4): 40 mg via ORAL
  Filled 2020-07-01 (×5): qty 1

## 2020-07-01 MED ORDER — SIMVASTATIN 10 MG PO TABS
20.0000 mg | ORAL_TABLET | Freq: Every day | ORAL | Status: DC
  Administered 2020-07-01 – 2020-07-03 (×3): 20 mg via ORAL
  Filled 2020-07-01 (×3): qty 2

## 2020-07-01 NOTE — BH Assessment (Addendum)
Spoke with [employer] Cat [phone]) about patient's status. The Sheriff's department never arrived to transport pt. Cat was informed and reported that since pt will be unable to arrive before 6pm, pt will not be accepted as of today 07-01-20. The facility will hold the pt's bed until Monday if placement is still needed. Cat requested a follow up call if pt is discharged or otherwise picked up by another facility.

## 2020-07-01 NOTE — ED Notes (Signed)
Call received from intake personnel patient has been accepted, Md to be called to rescind IVC, will make arrangements for transport with safe  transport to pick up patient.

## 2020-07-01 NOTE — ED Notes (Signed)
Patient early riser sitting on side of bed quiete and cooperative. Will monitor. Safety maintained.

## 2020-07-01 NOTE — ED Notes (Signed)
Daughter called updated on patient status an plan of care.

## 2020-07-01 NOTE — ED Notes (Signed)
Pt. Transferred from Triage to 24 hall after dressing out and screening for contraband. Report to include Situation, Background, Assessment and Recommendations from [facility]. Pt. Oriented to Quad including Q15 minute rounds as well as Engineer, drilling for their protection. Patient is alert and oriented, warm and dry in no acute distress. Patient denies HI, and AVH but does endorse SI with plan to stop taking her medications. Pt. Encouraged to let me know if needs arise.

## 2020-07-01 NOTE — ED Notes (Signed)
Hourly rounding reveals patient awake in recliner in hall talking to TTS and NP. No complaints, stable, in no acute distress. Q15 minute rounds and monitoring via Engineer, drilling to continue.

## 2020-07-01 NOTE — BH Assessment (Addendum)
Referral information for Psychiatric Hospitalization faxed to;    [facility] ([phone]- [phone]),    [facility] [phone]) [phone]   [facility]([phone], Under review with KTama Headings([phone],    [facility]([phone]- [phone],    [facility] ([phone],    [facility] ([phone]   [facility]([phone],
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
## 2020-07-01 NOTE — ED Notes (Signed)
Hourly rounding reveals patient awake in hall bed. No complaints, stable, in no acute distress. Q15 minute rounds and monitoring via Engineer, drilling to continue.

## 2020-07-01 NOTE — ED Notes (Signed)
Daughter at bedside to visit. updated on patient status and plan of care.

## 2020-07-01 NOTE — BH Assessment (Signed)
Patient has been accepted to [facility]  Accepting physician is Dr. Juliane Lack Rano.  Call report to [phone].  Representative was Cat.   ER Staff is aware of it:  Ronnie, ER Secretary  Dr. Cinda Quest, ER MD  Madlyn Frankel, Patient's Nurse  Representative (Cat) requested that the pt be transported before 6pm.

## 2020-07-01 NOTE — BH Assessment (Addendum)
Referral check for Psychiatric Hospitalization:   [facility] ([phone]),    [facility] [phone]) [phone] 7:51 am Currently under review.   [facility] [phone]), [facility] Left HIPPA compliant message.   [phone],    [phone],   [facility] [phone],    [facility] [phone]   [phone],

## 2020-07-01 NOTE — ED Notes (Signed)
Pt refused breakfast tray.

## 2020-07-01 NOTE — ED Notes (Signed)
Hourly rounding reveals patient sleeping in recliner in hall. No complaints, stable, in no acute distress. Q15 minute rounds and monitoring via Engineer, drilling to continue.
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
## 2020-07-01 NOTE — ED Notes (Signed)
Received call this morning from [facility] intake personell, all questions and concerns answered will be submitting patient for admission.

## 2020-07-01 NOTE — ED Notes (Signed)
Patient sitting in chair calm and cooperative ate 100% of her breakfast. Awaiting transfer. Will continue to monitor. Safety maintained.
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
## 2020-07-02 MED ORDER — ACETAMINOPHEN 325 MG PO TABS
ORAL_TABLET | ORAL | Status: AC
  Administered 2020-07-02: 650 mg via ORAL
  Filled 2020-07-02: qty 2

## 2020-07-02 MED ORDER — ACETAMINOPHEN 325 MG PO TABS: 650.0000 mg | ORAL_TABLET | Freq: Once | ORAL | Status: AC

## 2020-07-02 NOTE — ED Notes (Signed)
Pt given meal tray.

## 2020-07-02 NOTE — ED Notes (Signed)
Hourly rounding reveals patient sleeping in room. No complaints, stable, in no acute distress. Q15 minute rounds and monitoring via Engineer, drilling to continue.

## 2020-07-02 NOTE — ED Notes (Signed)
Daughter here for 15 mins visit. Wanded down by Garment/textile technologist.

## 2020-07-02 NOTE — ED Notes (Signed)
Pt c/o headache. Tylenol ordered.

## 2020-07-02 NOTE — ED Notes (Signed)
IVC pending transport to [facility] on Monday.

## 2020-07-02 NOTE — ED Notes (Signed)
Daughter updated on plan of care

## 2020-07-02 NOTE — ED Notes (Signed)
Pt started walking up the hall towards ED 19 when asked where pt was going pt stated she had to go to the restroom. Pt was shown the correct restroom to go to ED QUAD BR. Pt walked to restroom with stand by assist from this writer. Pt then turned around and stated that this was not the one she used last night. Writer explained  To pt that this is the only restroom for her to use at this time. Pt finally  Used restroom and walked out  With dirty brief in hand. Writer asked pt three times if she needed a new brief  And pt ignored Probation officer then shouted out that she needed a brief. Pt was given a new brief and was walked to [employer]. Pt then walked out with the same brief that she stated "The sink wet it". Writer then ask pt is she need help and pt got mad and asked if she could go somewhere by herself and was told that she can go to the bathroom and back to her bed while in the section of the ED and then proceeded the go and throw/slammed the same brief on the floor. Pt is now sitting on side of her bed mad at Probation officer. RN was notified of this situation.

## 2020-07-02 NOTE — ED Provider Notes (Addendum)
Emergency Medicine Observation Re-evaluation Note  Megan Baker is a 79 y.o. female, seen on rounds today.    Physical Exam  BP 140/75 (BP Location: Left Arm)   Pulse 68   Temp 98.3 F (36.8 C) (Oral)   Resp 16   Ht 5' 2"  (1.575 m)   Wt 56.2 kg   SpO2 100%   BMI 22.68 kg/m  Physical Exam patient sitting on side of bed holding her head think she is a mild headache.  She does not want any Tylenol today because she has not eaten anything yet.  I asked her to remind [place] after she eats if she still has a headache we will give her some Tylenol.  She is awake alert oriented.  Respiratory she is breathing normally with no distress.  ED Course / MDM  EKG:  Clinical Course as of Jul 02 714  Sat Jul 01, 2020  0415 The patient was evaluated in person by [place] with the psychiatry team.  She agreed with my assessment and that the patient will need geropsych placement.  TTS is sending out the patient's info to find an appropriate facility.   [CF]    Clinical Course User Index [CF] Hinda Kehr, MD   I have reviewed the labs performed to date as well as medications administered while in observation.   Plan  Patient w waiting for placement.  Understand she is under involuntary commitment.   Nena Polio, MD 07/02/20 [id number]    Nena Polio, MD 07/02/20 [id number]    Nena Polio, MD 07/02/20 [phone]

## 2020-07-02 NOTE — ED Notes (Signed)
Pt sitting calmly in bed at this time. Able to hold normal conversation. Pt requests to be "washed up" but reports needing help. Suggests warm wipes and pt nods in agreement.

## 2020-07-02 NOTE — ED Notes (Signed)
Report to include situation, background, assessment and recommendations from Production assistant, radio. Patient sleeping, respirations regular and unlabored. Q15 minute rounds and Officer observation to continue.

## 2020-07-03 NOTE — ED Notes (Signed)
Hourly rounding reveals patient sleeping in room. No complaints, stable, in no acute distress. Q15 minute rounds and monitoring via Engineer, drilling to continue.

## 2020-07-03 NOTE — ED Provider Notes (Signed)
Emergency Medicine Observation Re-evaluation Note  Megan Baker is a 79 y.o. female, seen on rounds today.  Pt initially presented to the ED for complaints of No chief complaint on file. Currently, the patient is calm, resting.  Physical Exam  BP (!) 161/79 (BP Location: Right Arm)   Pulse 70   Temp 98.6 F (37 C) (Oral)   Resp 18   Ht 5' 2"  (1.575 m)   Wt 56.2 kg   SpO2 99%   BMI 22.68 kg/m  Physical Exam   Gen: Calm, in NAD, resting in psych scrubs HEENT: NCAT CV: Well perfused Resp: Normal work of breathing, no apparent resp distress Neuro: Non-focal, no apparent deficits, at mental baseline Psych: Calm, resting  ED Course / MDM  EKG:EKG Interpretation  Date/Time:  Saturday July 01 2020 15:48:25 EDT Ventricular Rate:  68 PR Interval:  192 QRS Duration: 76 QT Interval:  474 QTC Calculation: 504 R Axis:   66 Text Interpretation: Normal sinus rhythm Possible Left atrial enlargement Nonspecific T wave abnormality Prolonged QT Abnormal ECG Confirmed by UNCONFIRMED, DOCTOR ([id number]), editor Mel Almond, Tammy [phone]) on 07/03/2020 8:22:32 AM  Clinical Course as of Jul 04 847  Sat Jul 01, 2020  0415 The patient was evaluated in person by [place] with the psychiatry team.  She agreed with my assessment and that the patient will need geropsych placement.  TTS is sending out the patient's info to find an appropriate facility.   [CF]    Clinical Course User Index [CF] Hinda Kehr, MD   I have reviewed the labs performed to date as well as medications administered while in observation.  Recent changes in the last 24 hours include: accepted to OSH, awaiting transfer . Plan  Current plan is for psych transfer and admission. Patient is under full IVC at this time.   Duffy Bruce, MD 07/03/20 [phone]

## 2020-07-03 NOTE — ED Notes (Signed)
Pt given breakfast tray

## 2020-07-03 NOTE — ED Notes (Addendum)
Patient voiced understanding of discharge instructions, all belongings taken with her, Nurse attempted to call  Daughter to let her know that she would be discharged and transferred to [place],(left message).  Patient took her po morning medications prior to leaving. Patient ambulated with Sheriff to car with walker, No signs of distress.

## 2020-08-01 ENCOUNTER — Ambulatory Visit (INDEPENDENT_AMBULATORY_CARE_PROVIDER_SITE_OTHER): Payer: Medicare Other | Admitting: Nurse Practitioner

## 2020-08-01 ENCOUNTER — Encounter (INDEPENDENT_AMBULATORY_CARE_PROVIDER_SITE_OTHER): Payer: Medicare Other

## 2020-10-26 ENCOUNTER — Other Ambulatory Visit: Payer: Self-pay | Admitting: Student

## 2020-10-26 DIAGNOSIS — Z1231 Encounter for screening mammogram for malignant neoplasm of breast: Secondary | ICD-10-CM

## 2020-12-07 ENCOUNTER — Other Ambulatory Visit: Payer: Self-pay

## 2020-12-07 ENCOUNTER — Ambulatory Visit
Admission: RE | Admit: 2020-12-07 | Discharge: 2020-12-07 | Disposition: A | Discharge status: Home / Self Care | Payer: Medicare Other | Source: Ambulatory Visit | Attending: Student | Admitting: Student

## 2020-12-07 DIAGNOSIS — Z1231 Encounter for screening mammogram for malignant neoplasm of breast: Secondary | ICD-10-CM | POA: Insufficient documentation

## 2021-10-25 ENCOUNTER — Other Ambulatory Visit: Payer: Self-pay | Admitting: Student

## 2021-10-25 DIAGNOSIS — Z1231 Encounter for screening mammogram for malignant neoplasm of breast: Secondary | ICD-10-CM

## 2021-12-13 ENCOUNTER — Other Ambulatory Visit: Payer: Self-pay

## 2021-12-13 ENCOUNTER — Ambulatory Visit
Admission: RE | Admit: 2021-12-13 | Discharge: 2021-12-13 | Disposition: A | Discharge status: Home / Self Care | Payer: Medicare Other | Source: Ambulatory Visit | Attending: Student | Admitting: Student

## 2021-12-13 DIAGNOSIS — Z1231 Encounter for screening mammogram for malignant neoplasm of breast: Secondary | ICD-10-CM | POA: Diagnosis present

## 2022-09-23 ENCOUNTER — Emergency Department
Admission: EM | Admit: 2022-09-23 | Discharge: 2022-09-23 | Disposition: A | Discharge status: Home / Self Care | Payer: Medicare Other | Attending: Emergency Medicine | Admitting: Emergency Medicine

## 2022-09-23 ENCOUNTER — Emergency Department: Payer: Medicare Other

## 2022-09-23 DIAGNOSIS — S0990XA Unspecified injury of head, initial encounter: Secondary | ICD-10-CM | POA: Diagnosis present

## 2022-09-23 DIAGNOSIS — S0101XA Laceration without foreign body of scalp, initial encounter: Secondary | ICD-10-CM | POA: Insufficient documentation

## 2022-09-23 DIAGNOSIS — F0390 Unspecified dementia without behavioral disturbance: Secondary | ICD-10-CM | POA: Insufficient documentation

## 2022-09-23 DIAGNOSIS — W01198A Fall on same level from slipping, tripping and stumbling with subsequent striking against other object, initial encounter: Secondary | ICD-10-CM | POA: Diagnosis not present

## 2022-09-23 DIAGNOSIS — Z7901 Long term (current) use of anticoagulants: Secondary | ICD-10-CM | POA: Diagnosis not present

## 2022-09-23 DIAGNOSIS — W19XXXA Unspecified fall, initial encounter: Secondary | ICD-10-CM

## 2022-09-23 MED ORDER — ACETAMINOPHEN 500 MG PO TABS
1000.0000 mg | ORAL_TABLET | Freq: Once | ORAL | Status: AC
  Administered 2022-09-23: 1000 mg via ORAL
  Filled 2022-09-23: qty 2

## 2022-09-23 NOTE — ED Provider Notes (Signed)
[facility] Provider Note    Event Date/Time   First MD Initiated Contact with Patient 09/23/22 [phone]     (approximate)   History   Fall   HPI  Megan Baker is a 81 y.o. female who presents to the ED for evaluation of Fall   I review visit from 9/26.  History of vascular dementia on DAPT with Plavix.  Patient presents to the ED after a fall.  She was with her daughter, reports she thinks that she tripped over some shoes.  Patient reports that she just tripped on the rug.  Denies any syncope.  She is laceration on her head and reports a headache, but denies any other complaints.  Denies any pain or injury to the extremities.  No recent illnesses.   Physical Exam   Triage Vital Signs: ED Triage Vitals  Enc Vitals Group     BP 09/23/22 0443 (!) 155/75     Pulse Rate 09/23/22 0443 (!) 59     Resp 09/23/22 0443 14     Temp 09/23/22 0443 98.5 F (36.9 C)     Temp Source 09/23/22 0443 Oral     SpO2 09/23/22 0443 98 %     Weight 09/23/22 0443 159 lb (72.1 kg)     Height 09/23/22 0443 5' 2"  (1.575 m)     Head Circumference --      Peak Flow --      Pain Score 09/23/22 0442 10     Pain Loc --      Pain Edu? --      Excl. in [place]? --     Most recent vital signs: Vitals:   09/23/22 0443  BP: (!) 155/75  Pulse: (!) 59  Resp: 14  Temp: 98.5 F (36.9 C)  SpO2: 98%    General: Awake, no distress.  CV:  Good peripheral perfusion.  Resp:  Normal effort.  Abd:  No distention.  MSK:  No deformity noted.  Neuro:  No focal deficits appreciated. Other:  Vertically oriented laceration to the top of the scalp about 3 cm in length   ED Results / Procedures / Treatments   Labs (all labs ordered are listed, but only abnormal results are displayed) Labs Reviewed - No data to display  EKG Sinus rhythm with a rate of 57 bpm.  Normal axis.  Prolonged QTc is noted.  No STEMI.  RADIOLOGY CT head interpreted by me without evidence of acute intracranial  pathology CT cervical spine interpreted by me without evidence of acute fracture or dislocation  Official radiology report(s): CT HEAD WO CONTRAST (5MM)  Result Date: 09/23/2022 CLINICAL DATA:  81 year old female with history of trauma from a fall. EXAM: CT HEAD WITHOUT CONTRAST CT CERVICAL SPINE WITHOUT CONTRAST TECHNIQUE: Multidetector CT imaging of the head and cervical spine was performed following the standard protocol without intravenous contrast. Multiplanar CT image reconstructions of the cervical spine were also generated. RADIATION DOSE REDUCTION: This exam was performed according to the departmental dose-optimization program which includes automated exposure control, adjustment of the mA and/or kV according to patient size and/or use of iterative reconstruction technique. COMPARISON:  Head CT 11/17/2019. FINDINGS: CT HEAD FINDINGS Brain: Mild cerebral atrophy. Patchy and confluent areas of decreased attenuation are noted throughout the deep and periventricular white matter of the cerebral hemispheres bilaterally, compatible with chronic microvascular ischemic disease. No evidence of acute infarction, hemorrhage, hydrocephalus, extra-axial collection or mass lesion/mass effect. Vascular: No hyperdense vessel or unexpected  calcification. Skull: Normal. Negative for fracture or focal lesion. Sinuses/Orbits: No acute finding. Other: Mild soft tissue swelling and small amount of gas in the high frontal scalp. None. CT CERVICAL SPINE FINDINGS Alignment: Reversal of normal cervical lordosis centered at the level of C3, likely positional. Alignment is otherwise anatomic. Skull base and vertebrae: No acute fracture. No primary bone lesion or focal pathologic process. Soft tissues and spinal canal: No prevertebral fluid or swelling. No visible canal hematoma. Disc levels: Multilevel degenerative disc disease, most severe at C3-C4 and C5-C6. Moderate multilevel facet arthropathy bilaterally. Upper chest:  Unremarkable. Other: None. IMPRESSION: 1. No evidence of significant acute traumatic injury to the skull, brain or cervical spine. 2. Mild cerebral atrophy with mild chronic microvascular ischemic changes in the cerebral white matter, as above. 3. Multilevel degenerative disc disease and cervical spondylosis, as above. Electronically Signed   By: Vinnie Langton M.D.   On: 09/23/2022 05:19   CT Cervical Spine Wo Contrast  Result Date: 09/23/2022 CLINICAL DATA:  81 year old female with history of trauma from a fall. EXAM: CT HEAD WITHOUT CONTRAST CT CERVICAL SPINE WITHOUT CONTRAST TECHNIQUE: Multidetector CT imaging of the head and cervical spine was performed following the standard protocol without intravenous contrast. Multiplanar CT image reconstructions of the cervical spine were also generated. RADIATION DOSE REDUCTION: This exam was performed according to the departmental dose-optimization program which includes automated exposure control, adjustment of the mA and/or kV according to patient size and/or use of iterative reconstruction technique. COMPARISON:  Head CT 11/17/2019. FINDINGS: CT HEAD FINDINGS Brain: Mild cerebral atrophy. Patchy and confluent areas of decreased attenuation are noted throughout the deep and periventricular white matter of the cerebral hemispheres bilaterally, compatible with chronic microvascular ischemic disease. No evidence of acute infarction, hemorrhage, hydrocephalus, extra-axial collection or mass lesion/mass effect. Vascular: No hyperdense vessel or unexpected calcification. Skull: Normal. Negative for fracture or focal lesion. Sinuses/Orbits: No acute finding. Other: Mild soft tissue swelling and small amount of gas in the high frontal scalp. None. CT CERVICAL SPINE FINDINGS Alignment: Reversal of normal cervical lordosis centered at the level of C3, likely positional. Alignment is otherwise anatomic. Skull base and vertebrae: No acute fracture. No primary bone lesion or  focal pathologic process. Soft tissues and spinal canal: No prevertebral fluid or swelling. No visible canal hematoma. Disc levels: Multilevel degenerative disc disease, most severe at C3-C4 and C5-C6. Moderate multilevel facet arthropathy bilaterally. Upper chest: Unremarkable. Other: None. IMPRESSION: 1. No evidence of significant acute traumatic injury to the skull, brain or cervical spine. 2. Mild cerebral atrophy with mild chronic microvascular ischemic changes in the cerebral white matter, as above. 3. Multilevel degenerative disc disease and cervical spondylosis, as above. Electronically Signed   By: Vinnie Langton M.D.   On: 09/23/2022 05:19    PROCEDURES and INTERVENTIONS:  .1-3 Lead EKG Interpretation  Performed by: Vladimir Crofts, MD Authorized by: Vladimir Crofts, MD     Interpretation: normal     ECG rate:  60   ECG rate assessment: normal     Rhythm: sinus rhythm     Ectopy: none     Conduction: normal   ..Laceration Repair  Date/Time: 09/23/2022 5:49 AM  Performed by: Vladimir Crofts, MD Authorized by: Vladimir Crofts, MD   Consent:    Consent obtained:  Verbal   Consent given by:  Guardian and healthcare agent Exploration:    Hemostasis achieved with:  Direct pressure   Imaging obtained comment:  CT   Imaging outcome: foreign body  not noted   Treatment:    Area cleansed with:  Povidone-iodine   Amount of cleaning:  Standard   Irrigation solution:  Sterile saline Skin repair:    Repair method:  Staples   Number of staples:  4 Approximation:    Approximation:  Close Repair type:    Repair type:  Simple Post-procedure details:    Dressing:  Open (no dressing)   Procedure completion:  Tolerated well, no immediate complications   Medications  acetaminophen (TYLENOL) tablet 1,000 mg (1,000 mg Oral Given 09/23/22 0501)     IMPRESSION / MDM / ASSESSMENT AND PLAN / ED COURSE  I reviewed the triage vital signs and the nursing notes.  Differential diagnosis includes, but  is not limited to, skull fracture, ICH, stroke, cardiac dysrhythmia, V. tach  {Patient presents with symptoms of an acute illness or injury that is potentially life-threatening  81 year old female presents to the ED after what sounds like a mechanical fall with a scalp laceration suitable for bedside repair and outpatient management.  She looks systemically well and has no evidence of neurologic or vascular deficits.  Imaging without evidence of ICH, skull fracture or cervical pathology.  Repaired, as above, which is well-tolerated.  Her EKG is noted to have a prolonged QTc, though she denies any syncope, chest pain or dysrhythmia symptoms.  She was kept on the monitor and has no dysrhythmia or V. tach.  We will have her follow-up as an outpatient.  Clinical Course as of 09/23/22 0547  Mon Sep 23, 2022  0530 Reassessed and discussed with daughter.  4 staples placed by me.  Well-tolerated [DS]    Clinical Course User Index [DS] Vladimir Crofts, MD     FINAL CLINICAL IMPRESSION(S) / ED DIAGNOSES   Final diagnoses:  Fall, initial encounter  Laceration of scalp, initial encounter  Injury of head, initial encounter     Rx / DC Orders   ED Discharge Orders     None        Note:  This document was prepared using Dragon voice recognition software and may include unintentional dictation errors.   Vladimir Crofts, MD 09/23/22 [phone]

## 2022-09-23 NOTE — ED Triage Notes (Addendum)
EMS spoke w/ pt family and reports pt had a witnessed mechanical fall.  Pt tripped in carpeting and hit head against closet door.  Pt has obvious laceration and bleeding on top of head.  Pt verbalizes same story.    (-) blood thinner (-) LOC

## 2022-09-23 NOTE — Discharge Instructions (Addendum)
Use Tylenol for pain and fevers.  Up to 1000 mg per dose, up to 4 times per day.  Do not take more than 4000 mg of Tylenol/acetaminophen within 24 hours.  The staples will need to be removed after 7-10 days.  This can be done at any doctor's office, urgent care or emergency room.  Gently wash the wound with soap and water.  It is okay to shower, but do not submerge in a bath or go swimming as it is healing.  Do not vigorously scrub.   Gently pat dry.   Once dry, then apply Neosporin or bacitracin or even Vaseline ointment to the area to act as a barrier to help prevent infection.

## 2022-10-05 ENCOUNTER — Encounter: Payer: Self-pay | Admitting: Emergency Medicine

## 2022-10-05 ENCOUNTER — Emergency Department
Admission: EM | Admit: 2022-10-05 | Discharge: 2022-10-05 | Disposition: A | Discharge status: Home / Self Care | Payer: Medicare Other | Attending: Emergency Medicine | Admitting: Emergency Medicine

## 2022-10-05 ENCOUNTER — Other Ambulatory Visit: Payer: Self-pay

## 2022-10-05 DIAGNOSIS — Z4802 Encounter for removal of sutures: Secondary | ICD-10-CM | POA: Diagnosis present

## 2022-10-05 NOTE — Discharge Instructions (Signed)
Clean area daily with mild soap and water and continue to watch for any signs of infection.

## 2022-10-05 NOTE — ED Provider Notes (Signed)
   [facility] Provider Note    Event Date/Time   First MD Initiated Contact with Patient 10/05/22 [phone]     (approximate)   History   Suture / Staple Removal   HPI  Megan Baker is a 81 y.o. female   presents to the ED for staple removal.  Patient was seen in the ED on 09/23/2022 after a fall and laceration to her scalp.  No concerns of infection and no complications.      Physical Exam   Triage Vital Signs: ED Triage Vitals  Enc Vitals Group     BP 10/05/22 0858 (!) 160/67     Pulse Rate 10/05/22 0858 63     Resp 10/05/22 0858 18     Temp 10/05/22 0858 98.2 F (36.8 C)     Temp Source 10/05/22 0858 Oral     SpO2 10/05/22 0858 95 %     Weight 10/05/22 0850 158 lb 15.2 oz (72.1 kg)     Height 10/05/22 0850 5' 2"  (1.575 m)     Head Circumference --      Peak Flow --      Pain Score 10/05/22 0850 0     Pain Loc --      Pain Edu? --      Excl. in [place]? --     Most recent vital signs: Vitals:   10/05/22 0858  BP: (!) 160/67  Pulse: 63  Resp: 18  Temp: 98.2 F (36.8 C)  SpO2: 95%     General: Awake, no distress.  CV:  Good peripheral perfusion.  Resp:  Normal effort.  Abd:  No distention.  Other:  Scalp has moderate amount of dried blood over the staples but no evidence of infection was noted.  No tenderness on palpation.  It was ambulatory to the exam room from the lobby.   ED Results / Procedures / Treatments   Labs (all labs ordered are listed, but only abnormal results are displayed) Labs Reviewed - No data to display    PROCEDURES:  Critical Care performed:   Procedures   MEDICATIONS ORDERED IN ED: Medications - No data to display   IMPRESSION / MDM / [place] / ED COURSE  I reviewed the triage vital signs and the nursing notes.   Differential diagnosis includes, but is not limited to, staple removal, wound recheck.  81 year old female presents to the ED for staple removal from her scalp that was  seen in the emergency department approximately 12 days ago.  Area does not show any signs of infection and after cleaning dried blood from the area staples were removed without any difficulty.  Patient is to follow-up with her PCP if any continued problems.      Patient's presentation is most consistent with acute, uncomplicated illness.  FINAL CLINICAL IMPRESSION(S) / ED DIAGNOSES   Final diagnoses:  Encounter for staple removal     Rx / DC Orders   ED Discharge Orders     None        Note:  This document was prepared using Dragon voice recognition software and may include unintentional dictation errors.   Johnn Hai, PA-C 10/05/22 1056    Vanessa , MD 10/05/22 1136

## 2022-10-05 NOTE — ED Triage Notes (Signed)
Pt here to have staples removed from the top of her head. Denies concern for infection

## 2022-11-11 ENCOUNTER — Other Ambulatory Visit: Payer: Self-pay | Admitting: Student

## 2022-11-11 DIAGNOSIS — Z1231 Encounter for screening mammogram for malignant neoplasm of breast: Secondary | ICD-10-CM

## 2022-12-20 ENCOUNTER — Ambulatory Visit
Admission: RE | Admit: 2022-12-20 | Discharge: 2022-12-20 | Disposition: A | Discharge status: Home / Self Care | Payer: Medicare Other | Source: Ambulatory Visit | Attending: Student | Admitting: Student

## 2022-12-20 DIAGNOSIS — Z1231 Encounter for screening mammogram for malignant neoplasm of breast: Secondary | ICD-10-CM | POA: Diagnosis present

## 2023-12-05 ENCOUNTER — Emergency Department: Payer: Medicare Other

## 2023-12-05 ENCOUNTER — Emergency Department
Admission: EM | Admit: 2023-12-05 | Discharge: 2023-12-06 | Disposition: A | Discharge status: Home / Self Care | Payer: Medicare Other | Attending: Emergency Medicine | Admitting: Emergency Medicine

## 2023-12-05 ENCOUNTER — Other Ambulatory Visit: Payer: Self-pay

## 2023-12-05 DIAGNOSIS — M25562 Pain in left knee: Secondary | ICD-10-CM | POA: Insufficient documentation

## 2023-12-05 DIAGNOSIS — F0390 Unspecified dementia without behavioral disturbance: Secondary | ICD-10-CM | POA: Insufficient documentation

## 2023-12-05 DIAGNOSIS — S0990XA Unspecified injury of head, initial encounter: Secondary | ICD-10-CM | POA: Insufficient documentation

## 2023-12-05 DIAGNOSIS — S61412A Laceration without foreign body of left hand, initial encounter: Secondary | ICD-10-CM | POA: Insufficient documentation

## 2023-12-05 DIAGNOSIS — W010XXA Fall on same level from slipping, tripping and stumbling without subsequent striking against object, initial encounter: Secondary | ICD-10-CM | POA: Insufficient documentation

## 2023-12-05 MED ORDER — LIDOCAINE HCL (PF) 1 % IJ SOLN
5.0000 mL | Freq: Once | INTRAMUSCULAR | Status: AC
  Administered 2023-12-05: 5 mL
  Filled 2023-12-05: qty 5

## 2023-12-05 NOTE — ED Provider Notes (Signed)
[facility] Provider Note    Event Date/Time   First MD Initiated Contact with Patient 12/05/23 2146     (approximate)   History   Fall   HPI  Megan Baker is a 82 year old female presenting to the ER for evaluation after a fall.  Patient reported that she tripped and she was taking the trash out.  She did have a left side of her head.  Not on anticoagulation.  No LOC.  Did sustain a laceration over her left hand.  Patient not able to clearly tell me the history of her fall on my initial evaluation, but daughter reports a history of dementia and reports that waxing and waning mental status is not atypical for the patient.  Tetanus up-to-date.    Physical Exam   Triage Vital Signs: ED Triage Vitals  Encounter Vitals Group     BP 12/05/23 1727 (!) 164/88     Systolic BP Percentile --      Diastolic BP Percentile --      Pulse Rate 12/05/23 1727 61     Resp 12/05/23 1727 18     Temp 12/05/23 1727 98.1 F (36.7 C)     Temp src --      SpO2 12/05/23 1727 95 %     Weight 12/05/23 1728 167 lb (75.8 kg)     Height 12/05/23 1728 5\' 4"  (1.626 m)     Head Circumference --      Peak Flow --      Pain Score 12/05/23 1727 10     Pain Loc --      Pain Education --      Exclude from Growth Chart --     Most recent vital signs: Vitals:   12/05/23 1727  BP: (!) 164/88  Pulse: 61  Resp: 18  Temp: 98.1 F (36.7 C)  SpO2: 95%    Nursing notes and vital signs reviewed.  General: Adult female, laying in bed, awake and interactive Head: Swelling and tenderness over the left temporal/periorbital area, normal extraocular movements Chest: Symmetric chest rise, no tenderness to palpation.  Cardiac: Regular rhythm and rate.  Respiratory: Lungs clear to auscultation Abdomen: Soft, nondistended. No tenderness to palpation.  Pelvis: Stable in AP and lateral compression. No tenderness to palpation. MSK: No deformity to bilateral upper and lower extremity.   Mild tenderness over the left knee.  There is a laceration over the left palm just distal to the MCP joints of the middle digits with some associated gaping.  Ongoing bleeding when cleaned, improved with direct pressure. Neuro: Alert, somewhat confused, GCS 14 at the time my evaluation.5 out of 5 strength in bilateral upper and lower extremities. Normal sensation to light touch in bilateral upper and lower extremity. Skin: No evidence of burns  ED Results / Procedures / Treatments   Labs (all labs ordered are listed, but only abnormal results are displayed) Labs Reviewed - No data to display   EKG EKG independently reviewed interpreted by myself (ER attending) demonstrates:    RADIOLOGY Imaging independently reviewed and interpreted by myself demonstrates:  CT head without acute bleed, radiology notes left cheek contusion CT C-spine without acute fracture CT max face without acute fracture Left knee x-Megan Baker without acute fracture Left hand x-Megan Baker without obvious fracture  PROCEDURES:  Critical Care performed: No  .Laceration Repair  Date/Time: 12/05/2023 11:45 PM  Performed by: Trinna Post, MD Authorized by: Trinna Post, MD   Consent:  Consent obtained:  Verbal   Consent given by:  Patient   Risks, benefits, and alternatives were discussed: yes   Laceration details:    Location:  Hand   Hand location:  L palm   Length (cm):  3 Treatment:    Irrigation solution:  Sterile saline Skin repair:    Repair method:  Sutures   Suture size:  5-0   Wound skin closure material used: vicryl rapide.   Suture technique:  Simple interrupted   Number of sutures:  3 Repair type:    Repair type:  Simple Post-procedure details:    Dressing: surgicel covered with gauze.   Procedure completion:  Tolerated well, no immediate complications    MEDICATIONS ORDERED IN ED: Medications  lidocaine (PF) (XYLOCAINE) 1 % injection 5 mL (5 mLs Infiltration Given by Other 12/05/23 2327)      IMPRESSION / MDM / ASSESSMENT AND PLAN / ED COURSE  I reviewed the triage vital signs and the nursing notes.  Differential diagnosis includes, but is not limited to, intracranial bleed, spine fracture, no evidence of thoracoabdominal trauma, hand fracture, knee fracture, soft tissue injury  Patient's presentation is most consistent with acute presentation with potential threat to life or bodily function.  82 year old female presenting to the emergency department for evaluation after a fall.  Vital stable on presentation.  Initial imaging fortunately reassuring.  On my exam, patient with laceration over the hand with some associated tenderness, so x-Megan Baker was ordered to further evaluate.  Laceration was repaired as above.  Patient and family are comfortable with discharge home.  Patient does live with her daughter.  Strict return precautions provided.  Patient discharged in stable condition.    FINAL CLINICAL IMPRESSION(S) / ED DIAGNOSES   Final diagnoses:  Closed head injury, initial encounter  Laceration of left palm, initial encounter     Rx / DC Orders   ED Discharge Orders     None        Note:  This document was prepared using Dragon voice recognition software and may include unintentional dictation errors.   Trinna Post, MD 12/05/23 [phone]

## 2023-12-05 NOTE — ED Triage Notes (Signed)
Pt to ED with daughter for fall today. Reports tripped while taking trash out. Reports hitting left side of head. Minimal swelling noted to left cheekbone. Denies blood thinners or LOC.  Laceration to palm of left hand, bleeding controlled.  Abrasion to left knee.

## 2023-12-05 NOTE — Discharge Instructions (Signed)
Your testing today fortunately did not show serious injuries.  Your sutures should dissolve on their own.  Follow with your primary care doctor for further evaluation.  Return to the ER for new or worsening symptoms.

## 2025-05-13 ENCOUNTER — Ambulatory Visit: Admitting: Nurse Practitioner
